# Patient Record
Sex: Male | Born: 1965 | Race: White | Hispanic: No | Marital: Married
Health system: Midwestern US, Community
[De-identification: ages and names within clinical notes are randomized; demographics above are authoritative.]

## PROBLEM LIST (undated history)

## (undated) DIAGNOSIS — D595 Paroxysmal nocturnal hemoglobinuria [Marchiafava-Micheli]: Secondary | ICD-10-CM

## (undated) DIAGNOSIS — Z8719 Personal history of other diseases of the digestive system: Secondary | ICD-10-CM

## (undated) DIAGNOSIS — E291 Testicular hypofunction: Secondary | ICD-10-CM

## (undated) DIAGNOSIS — D649 Anemia, unspecified: Secondary | ICD-10-CM

## (undated) DIAGNOSIS — N200 Calculus of kidney: Secondary | ICD-10-CM

## (undated) DIAGNOSIS — D751 Secondary polycythemia: Principal | ICD-10-CM

## (undated) DIAGNOSIS — E611 Iron deficiency: Principal | ICD-10-CM

## (undated) HISTORY — DX: Personal history of other diseases of the digestive system: Z87.19

## (undated) HISTORY — DX: Calculus of kidney: N20.0

## (undated) HISTORY — DX: Anemia, unspecified: D64.9

## (undated) HISTORY — DX: Paroxysmal nocturnal hemoglobinuria (Marchiafava-Micheli): D59.5

## (undated) HISTORY — DX: Testicular hypofunction: E29.1

## (undated) HISTORY — PX: HERNIA REPAIR: SHX51

---

## 2002-06-20 ENCOUNTER — Emergency Department (HOSPITAL_COMMUNITY): Admission: EM | Admit: 2002-06-20 | Discharge: 2002-06-20 | Payer: Self-pay

## 2004-02-13 ENCOUNTER — Ambulatory Visit: Payer: Self-pay | Admitting: Internal Medicine

## 2004-02-27 ENCOUNTER — Ambulatory Visit: Payer: Self-pay | Admitting: Internal Medicine

## 2004-03-09 ENCOUNTER — Ambulatory Visit: Payer: Self-pay | Admitting: Internal Medicine

## 2004-03-22 ENCOUNTER — Ambulatory Visit: Payer: Self-pay | Admitting: Internal Medicine

## 2004-04-09 ENCOUNTER — Ambulatory Visit: Payer: Self-pay | Admitting: Internal Medicine

## 2004-04-23 ENCOUNTER — Ambulatory Visit: Payer: Self-pay | Admitting: Internal Medicine

## 2004-05-04 ENCOUNTER — Ambulatory Visit: Payer: Self-pay | Admitting: Internal Medicine

## 2004-05-18 ENCOUNTER — Ambulatory Visit: Payer: Self-pay | Admitting: Internal Medicine

## 2004-06-01 ENCOUNTER — Ambulatory Visit: Payer: Self-pay | Admitting: Internal Medicine

## 2004-06-18 ENCOUNTER — Ambulatory Visit: Payer: Self-pay | Admitting: Internal Medicine

## 2004-06-29 ENCOUNTER — Ambulatory Visit: Payer: Self-pay | Admitting: Internal Medicine

## 2004-07-12 ENCOUNTER — Ambulatory Visit: Payer: Self-pay | Admitting: Internal Medicine

## 2004-07-27 ENCOUNTER — Ambulatory Visit: Payer: Self-pay | Admitting: Internal Medicine

## 2004-08-10 ENCOUNTER — Ambulatory Visit: Payer: Self-pay | Admitting: Internal Medicine

## 2004-08-24 ENCOUNTER — Ambulatory Visit: Payer: Self-pay | Admitting: Internal Medicine

## 2004-09-07 ENCOUNTER — Ambulatory Visit: Payer: Self-pay | Admitting: Internal Medicine

## 2004-09-21 ENCOUNTER — Ambulatory Visit: Payer: Self-pay | Admitting: Internal Medicine

## 2004-10-05 ENCOUNTER — Ambulatory Visit: Payer: Self-pay | Admitting: Internal Medicine

## 2004-10-19 ENCOUNTER — Ambulatory Visit: Payer: Self-pay | Admitting: Internal Medicine

## 2004-11-02 ENCOUNTER — Ambulatory Visit: Payer: Self-pay | Admitting: Internal Medicine

## 2004-11-16 ENCOUNTER — Ambulatory Visit: Payer: Self-pay | Admitting: Internal Medicine

## 2004-11-25 ENCOUNTER — Emergency Department (HOSPITAL_COMMUNITY): Admission: EM | Admit: 2004-11-25 | Discharge: 2004-11-25 | Payer: Self-pay | Admitting: Emergency Medicine

## 2004-11-30 ENCOUNTER — Ambulatory Visit: Payer: Self-pay | Admitting: Internal Medicine

## 2004-12-14 ENCOUNTER — Ambulatory Visit: Payer: Self-pay | Admitting: Internal Medicine

## 2004-12-28 ENCOUNTER — Ambulatory Visit: Payer: Self-pay | Admitting: Internal Medicine

## 2005-01-11 ENCOUNTER — Ambulatory Visit: Payer: Self-pay | Admitting: Internal Medicine

## 2005-01-28 ENCOUNTER — Ambulatory Visit: Payer: Self-pay | Admitting: Internal Medicine

## 2005-02-11 ENCOUNTER — Ambulatory Visit: Payer: Self-pay | Admitting: Internal Medicine

## 2005-02-25 ENCOUNTER — Ambulatory Visit: Payer: Self-pay | Admitting: Internal Medicine

## 2005-03-08 ENCOUNTER — Ambulatory Visit: Payer: Self-pay | Admitting: Internal Medicine

## 2005-03-21 ENCOUNTER — Ambulatory Visit: Payer: Self-pay | Admitting: Internal Medicine

## 2005-04-05 ENCOUNTER — Ambulatory Visit: Payer: Self-pay | Admitting: Internal Medicine

## 2005-04-19 ENCOUNTER — Ambulatory Visit: Payer: Self-pay | Admitting: Internal Medicine

## 2005-05-17 ENCOUNTER — Ambulatory Visit: Payer: Self-pay | Admitting: Internal Medicine

## 2005-06-14 ENCOUNTER — Ambulatory Visit: Payer: Self-pay | Admitting: Internal Medicine

## 2005-06-28 ENCOUNTER — Ambulatory Visit: Payer: Self-pay | Admitting: Internal Medicine

## 2005-07-12 ENCOUNTER — Ambulatory Visit: Payer: Self-pay | Admitting: Internal Medicine

## 2005-07-26 ENCOUNTER — Ambulatory Visit: Payer: Self-pay | Admitting: Internal Medicine

## 2005-08-09 ENCOUNTER — Ambulatory Visit: Payer: Self-pay | Admitting: Internal Medicine

## 2005-08-27 ENCOUNTER — Ambulatory Visit: Payer: Self-pay | Admitting: Internal Medicine

## 2005-09-06 ENCOUNTER — Ambulatory Visit: Payer: Self-pay | Admitting: Internal Medicine

## 2005-09-20 ENCOUNTER — Ambulatory Visit: Payer: Self-pay | Admitting: Internal Medicine

## 2005-10-04 ENCOUNTER — Ambulatory Visit: Payer: Self-pay | Admitting: Internal Medicine

## 2005-10-18 ENCOUNTER — Ambulatory Visit: Payer: Self-pay | Admitting: Internal Medicine

## 2005-11-01 ENCOUNTER — Ambulatory Visit: Payer: Self-pay | Admitting: Internal Medicine

## 2005-11-15 ENCOUNTER — Ambulatory Visit: Payer: Self-pay | Admitting: Internal Medicine

## 2005-12-03 ENCOUNTER — Ambulatory Visit: Payer: Self-pay | Admitting: Internal Medicine

## 2005-12-16 ENCOUNTER — Ambulatory Visit: Payer: Self-pay | Admitting: Internal Medicine

## 2005-12-27 ENCOUNTER — Ambulatory Visit: Payer: Self-pay | Admitting: Internal Medicine

## 2006-01-07 ENCOUNTER — Ambulatory Visit: Payer: Self-pay | Admitting: Internal Medicine

## 2006-01-10 ENCOUNTER — Ambulatory Visit: Payer: Self-pay | Admitting: Internal Medicine

## 2006-01-24 ENCOUNTER — Ambulatory Visit: Payer: Self-pay | Admitting: Internal Medicine

## 2006-01-27 ENCOUNTER — Ambulatory Visit: Payer: Self-pay | Admitting: Internal Medicine

## 2006-02-07 ENCOUNTER — Ambulatory Visit: Payer: Self-pay | Admitting: Internal Medicine

## 2006-02-24 ENCOUNTER — Ambulatory Visit: Payer: Self-pay | Admitting: Internal Medicine

## 2006-03-10 ENCOUNTER — Ambulatory Visit: Payer: Self-pay | Admitting: Internal Medicine

## 2006-03-21 ENCOUNTER — Ambulatory Visit: Payer: Self-pay | Admitting: Internal Medicine

## 2006-04-07 ENCOUNTER — Ambulatory Visit: Payer: Self-pay | Admitting: Internal Medicine

## 2006-04-22 ENCOUNTER — Ambulatory Visit: Payer: Self-pay | Admitting: Internal Medicine

## 2006-05-02 ENCOUNTER — Ambulatory Visit: Payer: Self-pay | Admitting: Internal Medicine

## 2006-05-16 ENCOUNTER — Ambulatory Visit: Payer: Self-pay | Admitting: Internal Medicine

## 2006-05-30 ENCOUNTER — Ambulatory Visit: Payer: Self-pay | Admitting: Internal Medicine

## 2006-06-13 ENCOUNTER — Ambulatory Visit: Payer: Self-pay | Admitting: Internal Medicine

## 2006-06-18 ENCOUNTER — Ambulatory Visit: Payer: Self-pay | Admitting: Internal Medicine

## 2006-06-18 LAB — CONVERTED CEMR LAB
ALT: 38 units/L (ref 0–40)
AST: 63 units/L — ABNORMAL HIGH (ref 0–37)
Basophils Absolute: 0 10*3/uL (ref 0.0–0.1)
Bilirubin, Direct: 0.3 mg/dL (ref 0.0–0.3)
CO2: 33 meq/L — ABNORMAL HIGH (ref 19–32)
Chloride: 103 meq/L (ref 96–112)
Cholesterol: 165 mg/dL (ref 0–200)
Eosinophils Absolute: 0 10*3/uL (ref 0.0–0.6)
GFR calc non Af Amer: 88 mL/min
Glucose, Bld: 83 mg/dL (ref 70–99)
HCT: 47.2 % (ref 39.0–52.0)
Hemoglobin: 14.1 g/dL (ref 13.0–17.0)
Ketones, ur: NEGATIVE mg/dL
Leukocytes, UA: NEGATIVE
Lymphocytes Relative: 10.5 % — ABNORMAL LOW (ref 12.0–46.0)
MCHC: 34.3 g/dL (ref 30.0–36.0)
MCV: 91.1 fL (ref 78.0–100.0)
Monocytes Absolute: 0.3 10*3/uL (ref 0.2–0.7)
Mucus, UA: NEGATIVE
Neutro Abs: 2.7 10*3/uL (ref 1.4–7.7)
Neutrophils Relative %: 79.2 % — ABNORMAL HIGH (ref 43.0–77.0)
Nitrite: NEGATIVE
Sodium: 142 meq/L (ref 135–145)
TSH: 2.04 microintl units/mL (ref 0.35–5.50)
Total CHOL/HDL Ratio: 4.4
Total Protein: 7 g/dL (ref 6.0–8.3)
pH: 6 (ref 5.0–8.0)

## 2006-06-23 ENCOUNTER — Ambulatory Visit: Payer: Self-pay | Admitting: Internal Medicine

## 2006-06-23 LAB — CONVERTED CEMR LAB
HCV Ab: NEGATIVE
Hep B S Ab: NEGATIVE
Saturation Ratios: 9.9 % — ABNORMAL LOW (ref 20.0–50.0)

## 2006-06-30 ENCOUNTER — Ambulatory Visit: Payer: Self-pay | Admitting: Internal Medicine

## 2006-07-02 ENCOUNTER — Ambulatory Visit: Payer: Self-pay | Admitting: Internal Medicine

## 2006-07-11 ENCOUNTER — Ambulatory Visit: Payer: Self-pay | Admitting: Internal Medicine

## 2006-07-25 ENCOUNTER — Ambulatory Visit: Payer: Self-pay | Admitting: Internal Medicine

## 2006-08-08 ENCOUNTER — Ambulatory Visit: Payer: Self-pay | Admitting: Internal Medicine

## 2006-08-27 ENCOUNTER — Encounter: Payer: Self-pay | Admitting: Endocrinology

## 2006-08-27 DIAGNOSIS — D596 Hemoglobinuria due to hemolysis from other external causes: Secondary | ICD-10-CM

## 2006-08-27 DIAGNOSIS — E291 Testicular hypofunction: Secondary | ICD-10-CM

## 2006-08-29 ENCOUNTER — Ambulatory Visit: Payer: Self-pay | Admitting: Internal Medicine

## 2006-09-12 ENCOUNTER — Ambulatory Visit: Payer: Self-pay | Admitting: Internal Medicine

## 2006-09-30 ENCOUNTER — Ambulatory Visit: Payer: Self-pay | Admitting: Internal Medicine

## 2006-10-10 ENCOUNTER — Ambulatory Visit: Payer: Self-pay | Admitting: Internal Medicine

## 2006-10-24 ENCOUNTER — Ambulatory Visit: Payer: Self-pay | Admitting: Internal Medicine

## 2006-11-10 ENCOUNTER — Ambulatory Visit: Payer: Self-pay | Admitting: Internal Medicine

## 2006-11-11 ENCOUNTER — Ambulatory Visit: Payer: Self-pay | Admitting: Internal Medicine

## 2006-11-28 ENCOUNTER — Ambulatory Visit: Payer: Self-pay | Admitting: Internal Medicine

## 2006-12-12 ENCOUNTER — Ambulatory Visit: Payer: Self-pay | Admitting: Internal Medicine

## 2006-12-29 ENCOUNTER — Ambulatory Visit: Payer: Self-pay | Admitting: Internal Medicine

## 2007-01-09 ENCOUNTER — Ambulatory Visit: Payer: Self-pay | Admitting: Internal Medicine

## 2007-01-13 ENCOUNTER — Ambulatory Visit: Payer: Self-pay | Admitting: Internal Medicine

## 2007-01-23 ENCOUNTER — Ambulatory Visit: Payer: Self-pay | Admitting: Internal Medicine

## 2007-02-06 ENCOUNTER — Ambulatory Visit: Payer: Self-pay | Admitting: Internal Medicine

## 2007-02-06 ENCOUNTER — Encounter: Payer: Self-pay | Admitting: Internal Medicine

## 2007-02-06 DIAGNOSIS — J168 Pneumonia due to other specified infectious organisms: Secondary | ICD-10-CM

## 2007-02-19 ENCOUNTER — Ambulatory Visit: Payer: Self-pay | Admitting: Internal Medicine

## 2007-03-10 ENCOUNTER — Ambulatory Visit: Payer: Self-pay | Admitting: Internal Medicine

## 2007-03-11 ENCOUNTER — Encounter: Payer: Self-pay | Admitting: Internal Medicine

## 2007-03-20 ENCOUNTER — Ambulatory Visit: Payer: Self-pay | Admitting: Internal Medicine

## 2007-04-03 ENCOUNTER — Ambulatory Visit: Payer: Self-pay | Admitting: Internal Medicine

## 2007-04-17 ENCOUNTER — Ambulatory Visit: Payer: Self-pay | Admitting: Internal Medicine

## 2007-05-01 ENCOUNTER — Ambulatory Visit: Payer: Self-pay | Admitting: Internal Medicine

## 2007-05-13 ENCOUNTER — Ambulatory Visit: Payer: Self-pay | Admitting: Internal Medicine

## 2007-05-29 ENCOUNTER — Ambulatory Visit: Payer: Self-pay | Admitting: Internal Medicine

## 2007-06-12 ENCOUNTER — Ambulatory Visit: Payer: Self-pay | Admitting: Internal Medicine

## 2007-06-26 ENCOUNTER — Ambulatory Visit: Payer: Self-pay | Admitting: Internal Medicine

## 2007-07-09 ENCOUNTER — Ambulatory Visit: Payer: Self-pay | Admitting: Internal Medicine

## 2007-07-14 ENCOUNTER — Ambulatory Visit: Payer: Self-pay | Admitting: Internal Medicine

## 2007-07-14 DIAGNOSIS — R071 Chest pain on breathing: Secondary | ICD-10-CM | POA: Insufficient documentation

## 2007-07-21 ENCOUNTER — Ambulatory Visit: Payer: Self-pay | Admitting: Internal Medicine

## 2007-07-22 ENCOUNTER — Ambulatory Visit: Payer: Self-pay | Admitting: Internal Medicine

## 2007-08-06 ENCOUNTER — Ambulatory Visit: Payer: Self-pay | Admitting: Internal Medicine

## 2007-08-20 ENCOUNTER — Encounter: Payer: Self-pay | Admitting: Endocrinology

## 2007-08-21 ENCOUNTER — Ambulatory Visit: Payer: Self-pay | Admitting: Internal Medicine

## 2007-09-04 ENCOUNTER — Ambulatory Visit: Payer: Self-pay | Admitting: Internal Medicine

## 2007-09-18 ENCOUNTER — Ambulatory Visit: Payer: Self-pay | Admitting: Internal Medicine

## 2007-10-01 ENCOUNTER — Telehealth (INDEPENDENT_AMBULATORY_CARE_PROVIDER_SITE_OTHER): Payer: Self-pay | Admitting: *Deleted

## 2007-10-01 ENCOUNTER — Ambulatory Visit: Payer: Self-pay | Admitting: Internal Medicine

## 2007-10-16 ENCOUNTER — Ambulatory Visit: Payer: Self-pay | Admitting: Internal Medicine

## 2007-10-29 ENCOUNTER — Ambulatory Visit: Payer: Self-pay | Admitting: Internal Medicine

## 2007-11-13 ENCOUNTER — Ambulatory Visit: Payer: Self-pay | Admitting: Internal Medicine

## 2007-11-27 ENCOUNTER — Ambulatory Visit: Payer: Self-pay | Admitting: Internal Medicine

## 2007-12-11 ENCOUNTER — Ambulatory Visit: Payer: Self-pay | Admitting: Internal Medicine

## 2007-12-25 ENCOUNTER — Ambulatory Visit: Payer: Self-pay | Admitting: Internal Medicine

## 2008-01-07 ENCOUNTER — Encounter: Payer: Self-pay | Admitting: Endocrinology

## 2008-01-08 ENCOUNTER — Ambulatory Visit: Payer: Self-pay | Admitting: Internal Medicine

## 2008-01-22 ENCOUNTER — Ambulatory Visit: Payer: Self-pay | Admitting: Internal Medicine

## 2008-02-05 ENCOUNTER — Ambulatory Visit: Payer: Self-pay | Admitting: Internal Medicine

## 2008-02-16 ENCOUNTER — Telehealth (INDEPENDENT_AMBULATORY_CARE_PROVIDER_SITE_OTHER): Payer: Self-pay | Admitting: *Deleted

## 2008-02-18 ENCOUNTER — Ambulatory Visit: Payer: Self-pay | Admitting: Internal Medicine

## 2008-02-18 DIAGNOSIS — K439 Ventral hernia without obstruction or gangrene: Secondary | ICD-10-CM | POA: Insufficient documentation

## 2008-03-04 ENCOUNTER — Ambulatory Visit: Payer: Self-pay | Admitting: Internal Medicine

## 2008-03-18 ENCOUNTER — Ambulatory Visit: Payer: Self-pay | Admitting: Internal Medicine

## 2008-04-04 ENCOUNTER — Ambulatory Visit: Payer: Self-pay | Admitting: Internal Medicine

## 2008-04-15 ENCOUNTER — Ambulatory Visit: Payer: Self-pay | Admitting: Internal Medicine

## 2008-04-29 ENCOUNTER — Ambulatory Visit: Payer: Self-pay | Admitting: Internal Medicine

## 2008-05-13 ENCOUNTER — Ambulatory Visit: Payer: Self-pay | Admitting: Internal Medicine

## 2008-05-30 ENCOUNTER — Ambulatory Visit: Payer: Self-pay | Admitting: Internal Medicine

## 2008-06-13 ENCOUNTER — Ambulatory Visit: Payer: Self-pay | Admitting: Internal Medicine

## 2008-06-24 ENCOUNTER — Ambulatory Visit: Payer: Self-pay | Admitting: Internal Medicine

## 2008-07-08 ENCOUNTER — Ambulatory Visit: Payer: Self-pay | Admitting: Internal Medicine

## 2008-07-14 ENCOUNTER — Encounter: Payer: Self-pay | Admitting: Endocrinology

## 2008-07-22 ENCOUNTER — Ambulatory Visit: Payer: Self-pay | Admitting: Internal Medicine

## 2008-08-05 ENCOUNTER — Ambulatory Visit: Payer: Self-pay | Admitting: Internal Medicine

## 2008-08-19 ENCOUNTER — Ambulatory Visit: Payer: Self-pay | Admitting: Internal Medicine

## 2008-09-02 ENCOUNTER — Ambulatory Visit: Payer: Self-pay | Admitting: Internal Medicine

## 2008-09-16 ENCOUNTER — Ambulatory Visit: Payer: Self-pay | Admitting: Internal Medicine

## 2008-09-29 ENCOUNTER — Ambulatory Visit: Payer: Self-pay | Admitting: Internal Medicine

## 2008-10-14 ENCOUNTER — Ambulatory Visit: Payer: Self-pay | Admitting: Internal Medicine

## 2008-10-28 ENCOUNTER — Ambulatory Visit: Payer: Self-pay | Admitting: Internal Medicine

## 2008-11-11 ENCOUNTER — Ambulatory Visit: Payer: Self-pay | Admitting: Internal Medicine

## 2008-11-14 ENCOUNTER — Ambulatory Visit: Payer: Self-pay | Admitting: Internal Medicine

## 2008-11-14 DIAGNOSIS — M79609 Pain in unspecified limb: Secondary | ICD-10-CM | POA: Insufficient documentation

## 2008-11-16 ENCOUNTER — Encounter: Payer: Self-pay | Admitting: Internal Medicine

## 2008-11-25 ENCOUNTER — Ambulatory Visit: Payer: Self-pay | Admitting: Internal Medicine

## 2008-12-09 ENCOUNTER — Ambulatory Visit: Payer: Self-pay | Admitting: Internal Medicine

## 2008-12-15 ENCOUNTER — Encounter: Payer: Self-pay | Admitting: Internal Medicine

## 2008-12-15 ENCOUNTER — Telehealth: Payer: Self-pay | Admitting: Internal Medicine

## 2008-12-15 ENCOUNTER — Ambulatory Visit: Payer: Self-pay | Admitting: Internal Medicine

## 2008-12-15 DIAGNOSIS — R509 Fever, unspecified: Secondary | ICD-10-CM

## 2008-12-15 DIAGNOSIS — D72819 Decreased white blood cell count, unspecified: Secondary | ICD-10-CM | POA: Insufficient documentation

## 2008-12-15 LAB — CONVERTED CEMR LAB
ALT: 66 units/L — ABNORMAL HIGH (ref 0–53)
Albumin: 4 g/dL (ref 3.5–5.2)
Basophils Relative: 0.5 % (ref 0.0–3.0)
Bilirubin, Direct: 0.4 mg/dL — ABNORMAL HIGH (ref 0.0–0.3)
CO2: 35 meq/L — ABNORMAL HIGH (ref 19–32)
Chloride: 100 meq/L (ref 96–112)
Creatinine, Ser: 1.1 mg/dL (ref 0.4–1.5)
Eosinophils Absolute: 0 10*3/uL (ref 0.0–0.7)
Eosinophils Relative: 0.3 % (ref 0.0–5.0)
HCT: 50.3 % (ref 39.0–52.0)
Hemoglobin: 17 g/dL (ref 13.0–17.0)
Leukocytes, UA: NEGATIVE
Lymphs Abs: 0.2 10*3/uL — ABNORMAL LOW (ref 0.7–4.0)
MCHC: 33.8 g/dL (ref 30.0–36.0)
MCV: 97.8 fL (ref 78.0–100.0)
Monocytes Absolute: 0.1 10*3/uL (ref 0.1–1.0)
Neutro Abs: 1.2 10*3/uL — ABNORMAL LOW (ref 1.4–7.7)
Potassium: 4.6 meq/L (ref 3.5–5.1)
RBC: 5.14 M/uL (ref 4.22–5.81)
Sodium: 141 meq/L (ref 135–145)
Specific Gravity, Urine: <=1.005 (ref 1.000–1.030)
Total Protein: 7 g/dL (ref 6.0–8.3)
Urine Glucose: NEGATIVE mg/dL
Urobilinogen, UA: 1 (ref 0.0–1.0)
WBC: 1.5 10*3/uL — CL (ref 4.5–10.5)

## 2008-12-16 ENCOUNTER — Telehealth: Payer: Self-pay | Admitting: Internal Medicine

## 2008-12-20 ENCOUNTER — Encounter: Payer: Self-pay | Admitting: Endocrinology

## 2008-12-28 ENCOUNTER — Ambulatory Visit: Payer: Self-pay | Admitting: Internal Medicine

## 2008-12-29 ENCOUNTER — Encounter: Payer: Self-pay | Admitting: Endocrinology

## 2009-01-03 ENCOUNTER — Encounter: Payer: Self-pay | Admitting: Internal Medicine

## 2009-01-06 ENCOUNTER — Ambulatory Visit: Payer: Self-pay | Admitting: Internal Medicine

## 2009-01-10 ENCOUNTER — Encounter: Payer: Self-pay | Admitting: Internal Medicine

## 2009-01-20 ENCOUNTER — Telehealth: Payer: Self-pay | Admitting: Internal Medicine

## 2009-01-20 ENCOUNTER — Ambulatory Visit: Payer: Self-pay | Admitting: Internal Medicine

## 2009-02-03 ENCOUNTER — Ambulatory Visit: Payer: Self-pay | Admitting: Internal Medicine

## 2009-02-07 ENCOUNTER — Telehealth (INDEPENDENT_AMBULATORY_CARE_PROVIDER_SITE_OTHER): Payer: Self-pay | Admitting: *Deleted

## 2009-02-07 ENCOUNTER — Encounter: Payer: Self-pay | Admitting: Internal Medicine

## 2009-02-17 ENCOUNTER — Ambulatory Visit: Payer: Self-pay | Admitting: Internal Medicine

## 2009-03-02 ENCOUNTER — Encounter: Payer: Self-pay | Admitting: Internal Medicine

## 2009-03-03 ENCOUNTER — Ambulatory Visit: Payer: Self-pay | Admitting: Internal Medicine

## 2009-03-20 ENCOUNTER — Ambulatory Visit: Payer: Self-pay | Admitting: Internal Medicine

## 2009-04-03 ENCOUNTER — Ambulatory Visit: Payer: Self-pay | Admitting: Internal Medicine

## 2009-04-05 ENCOUNTER — Ambulatory Visit: Payer: Self-pay | Admitting: Internal Medicine

## 2009-04-17 ENCOUNTER — Ambulatory Visit: Payer: Self-pay | Admitting: Internal Medicine

## 2009-04-18 ENCOUNTER — Ambulatory Visit: Payer: Self-pay | Admitting: Internal Medicine

## 2009-05-05 ENCOUNTER — Ambulatory Visit: Payer: Self-pay | Admitting: Internal Medicine

## 2009-05-19 ENCOUNTER — Telehealth: Payer: Self-pay | Admitting: Internal Medicine

## 2009-05-19 ENCOUNTER — Ambulatory Visit: Payer: Self-pay | Admitting: Internal Medicine

## 2009-06-02 ENCOUNTER — Ambulatory Visit: Payer: Self-pay | Admitting: Internal Medicine

## 2009-06-16 ENCOUNTER — Ambulatory Visit: Payer: Self-pay | Admitting: Internal Medicine

## 2009-06-30 ENCOUNTER — Telehealth: Payer: Self-pay | Admitting: Internal Medicine

## 2009-06-30 ENCOUNTER — Ambulatory Visit: Payer: Self-pay | Admitting: Internal Medicine

## 2009-07-14 ENCOUNTER — Ambulatory Visit: Payer: Self-pay | Admitting: Internal Medicine

## 2009-07-31 ENCOUNTER — Ambulatory Visit: Payer: Self-pay | Admitting: Internal Medicine

## 2009-08-11 ENCOUNTER — Ambulatory Visit: Payer: Self-pay | Admitting: Internal Medicine

## 2009-08-25 ENCOUNTER — Ambulatory Visit: Payer: Self-pay | Admitting: Internal Medicine

## 2009-08-30 ENCOUNTER — Ambulatory Visit: Payer: Self-pay | Admitting: Internal Medicine

## 2009-09-08 ENCOUNTER — Ambulatory Visit: Payer: Self-pay | Admitting: Internal Medicine

## 2009-09-25 ENCOUNTER — Ambulatory Visit: Payer: Self-pay | Admitting: Internal Medicine

## 2009-09-26 ENCOUNTER — Ambulatory Visit: Payer: Self-pay | Admitting: Internal Medicine

## 2009-09-28 ENCOUNTER — Encounter: Payer: Self-pay | Admitting: Endocrinology

## 2009-10-06 ENCOUNTER — Ambulatory Visit: Payer: Self-pay | Admitting: Internal Medicine

## 2009-10-16 ENCOUNTER — Encounter: Payer: Self-pay | Admitting: Endocrinology

## 2009-10-20 ENCOUNTER — Ambulatory Visit: Payer: Self-pay | Admitting: Internal Medicine

## 2009-11-06 ENCOUNTER — Ambulatory Visit: Payer: Self-pay | Admitting: Internal Medicine

## 2009-11-21 ENCOUNTER — Ambulatory Visit: Payer: Self-pay | Admitting: Internal Medicine

## 2009-12-01 ENCOUNTER — Ambulatory Visit: Payer: Self-pay | Admitting: Internal Medicine

## 2009-12-15 ENCOUNTER — Ambulatory Visit: Payer: Self-pay | Admitting: Internal Medicine

## 2009-12-22 ENCOUNTER — Ambulatory Visit: Payer: Self-pay | Admitting: Internal Medicine

## 2009-12-29 ENCOUNTER — Ambulatory Visit: Payer: Self-pay | Admitting: Internal Medicine

## 2010-01-03 ENCOUNTER — Encounter: Payer: Self-pay | Admitting: Internal Medicine

## 2010-01-12 ENCOUNTER — Ambulatory Visit: Payer: Self-pay | Admitting: Internal Medicine

## 2010-01-31 ENCOUNTER — Ambulatory Visit: Payer: Self-pay | Admitting: Internal Medicine

## 2010-02-02 ENCOUNTER — Ambulatory Visit: Payer: Self-pay | Admitting: Internal Medicine

## 2010-02-02 ENCOUNTER — Telehealth: Payer: Self-pay | Admitting: Internal Medicine

## 2010-02-02 ENCOUNTER — Ambulatory Visit: Payer: Self-pay | Admitting: Cardiology

## 2010-02-02 DIAGNOSIS — R109 Unspecified abdominal pain: Secondary | ICD-10-CM

## 2010-02-02 DIAGNOSIS — M542 Cervicalgia: Secondary | ICD-10-CM

## 2010-02-19 ENCOUNTER — Ambulatory Visit: Payer: Self-pay | Admitting: Internal Medicine

## 2010-03-05 ENCOUNTER — Ambulatory Visit: Payer: Self-pay | Admitting: Internal Medicine

## 2010-03-19 ENCOUNTER — Ambulatory Visit: Payer: Self-pay | Admitting: Internal Medicine

## 2010-03-30 ENCOUNTER — Ambulatory Visit: Payer: Self-pay | Admitting: Internal Medicine

## 2010-04-13 ENCOUNTER — Ambulatory Visit
Admission: RE | Admit: 2010-04-13 | Discharge: 2010-04-13 | Payer: Self-pay | Source: Home / Self Care | Attending: Internal Medicine | Admitting: Internal Medicine

## 2010-04-27 ENCOUNTER — Ambulatory Visit
Admission: RE | Admit: 2010-04-27 | Discharge: 2010-04-27 | Payer: Self-pay | Source: Home / Self Care | Attending: Internal Medicine | Admitting: Internal Medicine

## 2010-05-03 NOTE — Assessment & Plan Note (Signed)
Summary: TESTOSTERONE/MEN/NWS  Nurse Visit   Allergies: No Known Drug Allergies  Medication Administration  Injection # 1:    Medication: Testosterone Cypionat 200mg  ing    Diagnosis: HYPOGONADISM (ICD-257.2)    Route: IM    Site: RUOQ gluteus    Exp Date: 07/31/2011    Lot #: [id number]  Orders Added: 1)  Testosterone Cypionat 200mg  ing [J1080]

## 2010-05-03 NOTE — Assessment & Plan Note (Signed)
Summary: TEST INJ--MEN--STC  Nurse Visit   Allergies: No Known Drug Allergies  Medication Administration  Injection # 1:    Medication: Testosterone Cypionat 200mg  ing    Diagnosis: HYPOGONADISM (ICD-257.2)    Route: IM    Site: LUOQ gluteus    Exp Date: 07/31/2011    Lot #: [id number]    Comments: 1 cc    Patient tolerated injection without complications    Given by: Lamar Sprinkles, CMA (December 01, 2009 9:10 AM)  Orders Added: 1)  Admin of patients own med IM/SQ [96372M]   Medication Administration  Injection # 1:    Medication: Testosterone Cypionat 200mg  ing    Diagnosis: HYPOGONADISM (ICD-257.2)    Route: IM    Site: LUOQ gluteus    Exp Date: 07/31/2011    Lot #: [id number]    Comments: 1 cc    Patient tolerated injection without complications    Given by: Lamar Sprinkles, CMA (December 01, 2009 9:10 AM)  Orders Added: 1)  Admin of patients own med IM/SQ [phone]

## 2010-05-03 NOTE — Assessment & Plan Note (Signed)
Summary: TESTOSTERONE/ MEN Natale Milch  Nurse Visit  CC: Testosteron injection./kb   Allergies: No Known Drug Allergies  Medication Administration  Injection # 1:    Medication: Testosterone Cypionat 200mg  ing    Diagnosis: HYPOGONADISM (ICD-257.2)    Route: IM    Site: LUOQ gluteus    Exp Date: 07/31/2011    Lot #: [id number]    Mfr: watson    Comments: 1cc    Patient tolerated injection without complications    Given by: Lucious Groves (October 06, 2009 5:04 PM)  Orders Added: 1)  Testosterone Cypionat 200mg  ing [J1080] 2)  Admin of patients own med IM/SQ [[id number]]

## 2010-05-03 NOTE — Assessment & Plan Note (Signed)
Summary: TESTOSTERONE/ MEN /NWS  Nurse Visit   Allergies: No Known Drug Allergies  Medication Administration  Injection # 1:    Medication: Testosterone Cypionat 200mg  ing    Diagnosis: HYPOGONADISM (ICD-257.2)    Route: IM    Site: RUOQ gluteus    Exp Date: 05/03/2011    Lot #: [id number]    Mfr: Claudette Laws    Comments: 1 cc    Patient tolerated injection without complications    Given by: Lamar Sprinkles, CMA (June 16, 2009 1:29 PM)  Orders Added: 1)  Admin of patients own med IM/SQ [96372M]   Medication Administration  Injection # 1:    Medication: Testosterone Cypionat 200mg  ing    Diagnosis: HYPOGONADISM (ICD-257.2)    Route: IM    Site: RUOQ gluteus    Exp Date: 05/03/2011    Lot #: [id number]    Mfr: Claudette Laws    Comments: 1 cc    Patient tolerated injection without complications    Given by: Lamar Sprinkles, CMA (June 16, 2009 1:29 PM)  Orders Added: 1)  Admin of patients own med IM/SQ [phone]

## 2010-05-03 NOTE — Progress Notes (Signed)
     New Problems: PELVIC PAIN, ACUTE (ICD-789.09) NECK AND BACK PAIN (ICD-723.1)   New Problems: PELVIC PAIN, ACUTE (ICD-789.09) NECK AND BACK PAIN (ICD-723.1)

## 2010-05-03 NOTE — Assessment & Plan Note (Signed)
Summary: TESTOSTERONE/ MEN /NWS  Nurse Visit   Allergies: No Known Drug Allergies  Medication Administration  Injection # 1:    Medication: Testosterone Cypionat 200mg  ing    Diagnosis: HYPOGONADISM (ICD-257.2)    Route: IM    Site: LUOQ gluteus    Exp Date: 07/31/2011    Lot #: [id number]    Mfr: WATSON    Comments: 1 mL    Patient tolerated injection without complications    Given by: Lamar Sprinkles, CMA (January 12, 2010 10:00 AM)  Orders Added: 1)  Admin of patients own med IM/SQ [96372M]   Medication Administration  Injection # 1:    Medication: Testosterone Cypionat 200mg  ing    Diagnosis: HYPOGONADISM (ICD-257.2)    Route: IM    Site: LUOQ gluteus    Exp Date: 07/31/2011    Lot #: [id number]    Mfr: WATSON    Comments: 1 mL    Patient tolerated injection without complications    Given by: Lamar Sprinkles, CMA (January 12, 2010 10:00 AM)  Orders Added: 1)  Admin of patients own med IM/SQ [phone]

## 2010-05-03 NOTE — Assessment & Plan Note (Signed)
Summary: TESTOSTERONE INJ/ MEN /NWS  Nurse Visit   Allergies: No Known Drug Allergies  Medication Administration  Injection # 1:    Medication: Testosterone Cypionat 200mg  ing    Diagnosis: HYPOGONADISM (ICD-257.2)    Route: IM    Site: RUOQ gluteus    Exp Date: 05/03/2011    Lot #: [id number]    Mfr: Claudette Laws    Patient tolerated injection without complications    Given by: Lucious Groves (April 05, 2009 9:10 AM)  Orders Added: 1)  Testosterone Cypionat 200mg  ing [J1080] 2)  Admin of Therapeutic Inj  intramuscular or subcutaneous [[id number]]

## 2010-05-03 NOTE — Assessment & Plan Note (Signed)
Summary: TESTOSTERONE/ MEN /NWS  Nurse Visit   Allergies: No Known Drug Allergies  Medication Administration  Injection # 1:    Medication: Testosterone Cypionat 200mg  ing    Diagnosis: HYPOGONADISM (ICD-257.2)    Route: IM    Site: RUOQ gluteus    Exp Date: 07/31/2011    Lot #: [id number]    Mfr: Abraxis    Patient tolerated injection without complications    Given by: Margaret Pyle, CMA (October 20, 2009 10:17 AM)  Orders Added: 1)  Admin of patients own med IM/SQ [phone]

## 2010-05-03 NOTE — Assessment & Plan Note (Signed)
Summary: TESTOSTERONE/ MEN /NWS  Nurse Visit   Allergies: No Known Drug Allergies  Medication Administration  Injection # 1:    Medication: Testosterone Cypionat 200mg  ing    Diagnosis: HYPOGONADISM (ICD-257.2)    Route: IM    Site: LUOQ gluteus    Exp Date: 07/31/2011    Lot #: [id number]    Patient tolerated injection without complications    Given by: Lamar Sprinkles, CMA (June 30, 2009 11:45 AM)  Orders Added: 1)  Admin of patients own med IM/SQ [96372M]   Medication Administration  Injection # 1:    Medication: Testosterone Cypionat 200mg  ing    Diagnosis: HYPOGONADISM (ICD-257.2)    Route: IM    Site: LUOQ gluteus    Exp Date: 07/31/2011    Lot #: [id number]    Patient tolerated injection without complications    Given by: Lamar Sprinkles, CMA (June 30, 2009 11:45 AM)  Orders Added: 1)  Admin of patients own med IM/SQ [phone]

## 2010-05-03 NOTE — Assessment & Plan Note (Signed)
Summary: testosterone injection-lb  Nurse Visit   Vitals Entered By: Josph Macho RMA (August 30, 2009 3:18 PM)  Allergies: No Known Drug Allergies  Medication Administration  Injection # 1:    Medication: Testosterone Cypionat 200mg  ing    Diagnosis: HYPOGONADISM (ICD-257.2)    Route: SQ    Site: RUOQ gluteus    Exp Date: 07/31/2011    Lot #: [id number]    Mfr: Claudette Laws    Patient tolerated injection without complications    Given by: Josph Macho RMA (August 30, 2009 3:22 PM)  Orders Added: 1)  Admin of patients own med IM/SQ [phone]

## 2010-05-03 NOTE — Assessment & Plan Note (Signed)
Summary: testosterone/will bring inj/#/cd  Nurse Visit   Allergies: No Known Drug Allergies  Medication Administration  Injection # 1:    Medication: Testosterone Cypionat 200mg  ing    Diagnosis: HYPOGONADISM (ICD-257.2)    Site: RUOQ gluteus    Exp Date: 10/31/2011    Lot #: [id number]    Comments: 3/4 CC     Patient tolerated injection without complications    Given by: Lamar Sprinkles, CMA (March 05, 2010 2:26 PM)  Orders Added: 1)  Admin of patients own med IM/SQ [96372M]   Medication Administration  Injection # 1:    Medication: Testosterone Cypionat 200mg  ing    Diagnosis: HYPOGONADISM (ICD-257.2)    Site: RUOQ gluteus    Exp Date: 10/31/2011    Lot #: [id number]    Comments: 3/4 CC     Patient tolerated injection without complications    Given by: Lamar Sprinkles, CMA (March 05, 2010 2:26 PM)  Orders Added: 1)  Admin of patients own med IM/SQ [phone]

## 2010-05-03 NOTE — Assessment & Plan Note (Signed)
Summary: TESTOSTERONE/ MEN /NWS  Nurse Visit   Allergies: No Known Drug Allergies  Medication Administration  Injection # 1:    Medication: Testosterone Cypionat 200mg  ing    Diagnosis: HYPOGONADISM (ICD-257.2)    Route: IM    Site: RUOQ gluteus    Exp Date: 05/03/2011    Lot #: [id number]    Mfr: Claudette Laws    Patient tolerated injection without complications    Given by: Lucious Groves (May 05, 2009 8:20 AM)  Orders Added: 1)  Testosterone Cypionat 200mg  ing [J1080] 2)  Admin of patients own med IM/SQ [[id number]]

## 2010-05-03 NOTE — Progress Notes (Signed)
  Phone Note Other Incoming   Summary of Call: EMR ORDER - xray Initial call taken by: Lamar Sprinkles, CMA,  May 19, 2009 2:46 PM

## 2010-05-03 NOTE — Assessment & Plan Note (Signed)
Summary: PER PT D/T--TEST--MEN---STC  Nurse Visit   Allergies: No Known Drug Allergies  Medication Administration  Injection # 1:    Medication: Testosterone Cypionat 200mg  ing    Diagnosis: HYPOGONADISM (ICD-257.2)    Route: IM    Site: LUOQ gluteus    Exp Date: 10/31/2011    Lot #: [id number]    Mfr: [employer]    Comments: pt rec 3/4cc    Patient tolerated injection without complications    Given by: Lanier Prude, [facility]) (March 19, 2010 10:17 AM)  Orders Added: 1)  Admin of patients own med IM/SQ [phone]

## 2010-05-03 NOTE — Progress Notes (Signed)
Summary: LETTER  Phone Note Call from Patient   Summary of Call: Pt needs a letter from Dr Debby Bud asap. He has applied for insurance as not to lose coverage while changing jobs. He was sent to review when applying due to having gallstones as a teenager, BMI (pt goes to gym on regular basis) and med given recenlty for anxiety. Are you able to help w/ letter stating pt is in good health?  Initial call taken by: Lamar Sprinkles, CMA,  June 30, 2009 11:42 AM  Follow-up for Phone Call        can do a letter - if letterhead required will not be ready until Tuesday at the earliest. Follow-up by: Jacques Navy MD,  June 30, 2009 1:17 PM  Additional Follow-up for Phone Call Additional follow up Details #1::        That should be fine if it can be picked up next week before you leave.  Additional Follow-up by: Lamar Sprinkles, CMA,  June 30, 2009 1:37 PM

## 2010-05-03 NOTE — Assessment & Plan Note (Signed)
Summary: TEST MEN STC  Nurse Visit   Allergies: No Known Drug Allergies  Medication Administration  Injection # 1:    Medication: Testosterone Cypionat 200mg  ing    Diagnosis: HYPOGONADISM (ICD-257.2)    Route: IM    Site: LUOQ gluteus    Exp Date: 05/03/2011    Lot #: [id number]    Mfr: Claudette Laws    Patient tolerated injection without complications    Given by: Lamar Sprinkles, CMA (April 18, 2009 1:43 PM)  Orders Added: 1)  Admin of patients own med IM/SQ [96372M]   Medication Administration  Injection # 1:    Medication: Testosterone Cypionat 200mg  ing    Diagnosis: HYPOGONADISM (ICD-257.2)    Route: IM    Site: LUOQ gluteus    Exp Date: 05/03/2011    Lot #: [id number]    Mfr: Claudette Laws    Patient tolerated injection without complications    Given by: Lamar Sprinkles, CMA (April 18, 2009 1:43 PM)  Orders Added: 1)  Admin of patients own med IM/SQ [phone]

## 2010-05-03 NOTE — Assessment & Plan Note (Signed)
Summary: TESTOSTERONE/ MEN /NWS  Nurse Visit   Allergies: No Known Drug Allergies  Medication Administration  Injection # 1:    Medication: Testosterone Cypionat 200mg  ing    Diagnosis: HYPOGONADISM (ICD-257.2)    Route: IM    Site: LUOQ gluteus    Exp Date: 07/31/2011    Lot #: [id number]    Mfr: Claudette Laws    Patient tolerated injection without complications    Given by: Lamar Sprinkles, CMA (November 06, 2009 4:33 PM)  Orders Added: 1)  Admin of patients own med IM/SQ [96372M]   Medication Administration  Injection # 1:    Medication: Testosterone Cypionat 200mg  ing    Diagnosis: HYPOGONADISM (ICD-257.2)    Route: IM    Site: LUOQ gluteus    Exp Date: 07/31/2011    Lot #: [id number]    Mfr: Claudette Laws    Patient tolerated injection without complications    Given by: Lamar Sprinkles, CMA (November 06, 2009 4:33 PM)  Orders Added: 1)  Admin of patients own med IM/SQ [phone]

## 2010-05-03 NOTE — Assessment & Plan Note (Signed)
Summary: TESTOSTERONE/ MEN /NWS  Nurse Visit   Allergies: No Known Drug Allergies  Medication Administration  Injection # 1:    Medication: Testosterone Cypionat 200mg  ing    Diagnosis: HYPOGONADISM (ICD-257.2)    Route: IM    Site: RUOQ gluteus    Exp Date: 07/31/2011    Lot #: [id number]    Mfr: Claudette Laws    Patient tolerated injection without complications    Given by: Ami Bullins CMA (December 29, 2009 9:22 AM)  Orders Added: 1)  Testosterone Cypionat 200mg  ing [J1080]

## 2010-05-03 NOTE — Assessment & Plan Note (Signed)
Summary: TESTOSTERONE INJ/MEN/CD - COMING AFTER 1PM/CD  Nurse Visit   Allergies: No Known Drug Allergies  Medication Administration  Injection # 1:    Medication: Testosterone Cypionat 200mg  ing    Diagnosis: HYPOGONADISM (ICD-257.2)    Route: IM    Site: RUOQ gluteus    Exp Date: 07/31/2011    Lot #: [id number]    Mfr: Claudette Laws    Comments: 1 cc    Patient tolerated injection without complications    Given by: Lamar Sprinkles, CMA (September 26, 2009 1:53 PM)  Orders Added: 1)  Admin of patients own med IM/SQ [96372M]   Medication Administration  Injection # 1:    Medication: Testosterone Cypionat 200mg  ing    Diagnosis: HYPOGONADISM (ICD-257.2)    Route: IM    Site: RUOQ gluteus    Exp Date: 07/31/2011    Lot #: [id number]    Mfr: Claudette Laws    Comments: 1 cc    Patient tolerated injection without complications    Given by: Lamar Sprinkles, CMA (September 26, 2009 1:53 PM)  Orders Added: 1)  Admin of patients own med IM/SQ [phone]

## 2010-05-03 NOTE — Assessment & Plan Note (Signed)
Summary: TESTOSTERONE/ MEN Natale Milch  Nurse Visit   Vitals Entered By: Lamar Sprinkles, CMA (Jul 31, 2009 4:13 PM)  Allergies: No Known Drug Allergies  Medication Administration  Injection # 1:    Medication: Testosterone Cypionat 200mg  ing    Diagnosis: HYPOGONADISM (ICD-257.2)    Route: IM    Site: RUOQ gluteus    Exp Date: 07/31/2011    Lot #: [id number]    Patient tolerated injection without complications    Given by: Lamar Sprinkles, CMA (Jul 31, 2009 4:13 PM)  Orders Added: 1)  Admin of patients own med IM/SQ [96372M]   Medication Administration  Injection # 1:    Medication: Testosterone Cypionat 200mg  ing    Diagnosis: HYPOGONADISM (ICD-257.2)    Route: IM    Site: RUOQ gluteus    Exp Date: 07/31/2011    Lot #: [id number]    Patient tolerated injection without complications    Given by: Lamar Sprinkles, CMA (Jul 31, 2009 4:13 PM)  Orders Added: 1)  Admin of patients own med IM/SQ [phone]

## 2010-05-03 NOTE — Assessment & Plan Note (Signed)
Summary: BREATHING PROBLEM-BACK CHEST AND ABD PAIN STC   Vital Signs:  Patient profile:   45 year old male Height:      71 inches Weight:      223 pounds BMI:     31.21 O2 Sat:      97 % on Room air Temp:     97.4 degrees F oral Pulse rate:   61 / minute BP sitting:   118 / 80  (left arm) Cuff size:   large  Vitals Entered By: Ami Bullins CMA (January 31, 2010 9:28 AM)  O2 Flow:  Room air CC: pt here with c/o ongoing back pain and breathing issues/ ab Comments pt is no longer taking lorazepam, or cyclobenzaprine   Primary Care Lainee Lehrman:  Jacques Navy MD  CC:  pt here with c/o ongoing back pain and breathing issues/ ab.  History of Present Illness: Patient has been having thoracic back pain an difficulty breathing. 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 He has had pain for 12-16 months: from his neck to his groin. intermittent pain, can be assoicated with meals. He can feel a pulling and tugging with motion. Reviewed records from [facility] - he had an U/S recently that was normal. He has had normal chest x-rays.  Current Medications (verified): 1)  Testosterone Cypionate 200 Mg/ml  Oil (Testosterone Cypionate) .Marland Kitchen.. 1cc Q 2 Wks 2)  Folic Acid 1 Mg  Tabs (Folic Acid) .... One By Mouth Once Daily 3)  One-Daily Multivitamins   Tabs (Multiple Vitamin) .... One By Mouth Once Daily 4)  Lorazepam 0.5 Mg Tabs (Lorazepam) .Marland Kitchen.. 1 By Mouth Q6 As Needed Dyspnea or Anxiety 5)  Cyclobenzaprine Hcl 10 Mg Tabs (Cyclobenzaprine Hcl) .Marland Kitchen.. 1 By Mouth Three Times A Day As Needed For Muscle Spasm  Allergies (verified): No Known Drug Allergies  Past History:  Past Medical History: Last updated: 12/15/2008 Hypogonadism Paroxysmal nocturnal hemoglobinuria - [facility] - dr. Elta Guadeloupe [phone] FH reviewed for relevance, SH/Risk Factors reviewed for relevance  Review of Systems       The patient complains of abdominal pain.  The patient denies anorexia,  fever, weight loss, weight gain, decreased hearing, chest pain, dyspnea on exertion, peripheral edema, melena, hematochezia, muscle weakness, difficulty walking, abnormal bleeding, and enlarged lymph nodes.    Physical Exam  General:  Atheletic apapearing white male in no distress Head:  normocephalic.   Eyes:  vision grossly intact, pupils equal, and pupils round. S clear  Lungs:  normal respiratory effort.   Heart:  normal rate and regular rhythm.   Msk:  normal ROM.   Pulses:  2+ radial Neurologic:  alert & oriented X3, cranial nerves II-XII intact, and gait normal.   Skin:  turgor normal and color normal.   Psych:  Oriented X3, normally interactive, and good eye contact.     Impression & Recommendations:  Problem # 1:  CHEST WALL PAIN (JYN-829.56) Patient describes paqin and discomfort that involves the entire torso neck to groin that is intermittent. He dos not appear sick. He has had normal chest x-rays and a recent U/S abdomen at [facility] that wasw read out as normal. His hematologist does not feel this symptom complex is related to PNH. His symptoms may be musculo-skeletal in origin.  Plan - CT thoracic and lumbar spine to r/o discogenic pain           If CTs negative recommend PT consult -  he is provided a referral to [facility]. to use if needed.  Orders: Radiology Referral (Radiology)  Complete Medication List: 1)  Testosterone Cypionate 200 Mg/ml Oil (Testosterone cypionate) .Marland Kitchen.. 1cc q 2 wks 2)  Folic Acid 1 Mg Tabs (Folic acid) .... One by mouth once daily 3)  One-daily Multivitamins Tabs (Multiple vitamin) .... One by mouth once daily 4)  Lorazepam 0.5 Mg Tabs (Lorazepam) .Marland Kitchen.. 1 by mouth q6 as needed dyspnea or anxiety 5)  Cyclobenzaprine Hcl 10 Mg Tabs (Cyclobenzaprine hcl) .Marland Kitchen.. 1 by mouth three times a day as needed for muscle spasm   Orders Added: 1)  Radiology Referral [Radiology] 2)  Est. Patient Level IV [16109]    Prevention & Chronic  Care Immunizations   Influenza vaccine: Not documented    Tetanus booster: Not documented    Pneumococcal vaccine: Not documented  Other Screening   Smoking status: never  (07/14/2007)  Lipids   Total Cholesterol: 165  (06/18/2006)   LDL: 114  (06/18/2006)   LDL Direct: Not documented   HDL: 37.6  (06/18/2006)   Triglycerides: 65  (06/18/2006)

## 2010-05-03 NOTE — Assessment & Plan Note (Signed)
Summary: TESTOSTERONE/ MEN Natale Milch  Nurse Visit   Vitals Entered By: Lamar Sprinkles, CMA (Jul 31, 2009 4:11 PM)  Allergies: No Known Drug Allergies  Medication Administration  Injection # 1:    Medication: Testosterone Cypionat 200mg  ing    Diagnosis: HYPOGONADISM (ICD-257.2)    Route: IM    Site: LUOQ gluteus    Exp Date: 07/31/2011    Lot #: [id number]    Patient tolerated injection without complications    Given by: Lamar Sprinkles, CMA (Jul 31, 2009 4:12 PM)  Orders Added: 1)  Admin of patients own med IM/SQ [16109U]   Medication Administration  Injection # 1:    Medication: Testosterone Cypionat 200mg  ing    Diagnosis: HYPOGONADISM (ICD-257.2)    Route: IM    Site: LUOQ gluteus    Exp Date: 07/31/2011    Lot #: [id number]    Patient tolerated injection without complications    Given by: Lamar Sprinkles, CMA (Jul 31, 2009 4:12 PM)  Orders Added: 1)  Admin of patients own med IM/SQ [phone]

## 2010-05-03 NOTE — Assessment & Plan Note (Signed)
Summary: per d/t--test inj--men--stc  Nurse Visit   Allergies: No Known Drug Allergies  Medication Administration  Injection # 1:    Medication: Testosterone Cypionat 200mg  ing    Route: IM    Site: LUOQ gluteus    Exp Date: 07/31/2011    Lot #: [id number]    Comments: 1 CC    Patient tolerated injection without complications    Given by: Lamar Sprinkles, CMA (Aug 11, 2009 12:01 PM)  Orders Added: 1)  Admin of patients own med IM/SQ [96372M]   Medication Administration  Injection # 1:    Medication: Testosterone Cypionat 200mg  ing    Route: IM    Site: LUOQ gluteus    Exp Date: 07/31/2011    Lot #: [id number]    Comments: 1 CC    Patient tolerated injection without complications    Given by: Lamar Sprinkles, CMA (Aug 11, 2009 12:01 PM)  Orders Added: 1)  Admin of patients own med IM/SQ [phone]

## 2010-05-03 NOTE — Assessment & Plan Note (Signed)
Summary: sob/NOT chest pain/cd   Vital Signs:  Patient profile:   45 year old male Height:      71 inches Weight:      220 pounds BMI:     30.79 O2 Sat:      97 % on Room air Temp:     98.3 degrees F oral Pulse rate:   74 / minute BP sitting:   124 / 84  (left arm) Cuff size:   large  Vitals Entered By: Bill Salinas CMA (May 19, 2009 3:53 PM)  O2 Flow:  Room air CC: pt here for evaluation of SOB x 2 weeks with chest discomfort/ ab   Primary Care Provider:  Jacques Navy MD  CC:  pt here for evaluation of SOB x 2 weeks with chest discomfort/ ab.  History of Present Illness: Patient is seen acutely for difficulty getting a deep breath. He has a feeling of air hunger and discomfort, diferent from his known reflux, in the center of his chest. He has had no limitations in his activity. He has no prior history of pulmonary disease. He has no risk factors for PE. He reports he can participate in vigorous activity without dyspnea yet at rest he will find that he takes deep breaths/sighs and feels he is not getting enough oxygen. He has had no syncope or near-syncope.  Current Medications (verified): 1)  Testosterone Cypionate 200 Mg/ml  Oil (Testosterone Cypionate) .Marland Kitchen.. 1cc Q 2 Wks 2)  Folic Acid 1 Mg  Tabs (Folic Acid) .... One By Mouth Once Daily 3)  One-Daily Multivitamins   Tabs (Multiple Vitamin) .... One By Mouth Once Daily  Allergies (verified): No Known Drug Allergies  Past History:  Past Medical History: Last updated: 12/15/2008 Hypogonadism Paroxysmal nocturnal hemoglobinuria - [facility] - dr. 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     Physical Exam  General:  WNWD white male in no distress Head:  Normocephalic and atraumatic without obvious abnormalities. No apparent alopecia or balding. Eyes:  pupils equal, pupils round, corneas and lenses clear, and no injection.   Neck:  supple.   Lungs:  normal respiratory effort, no intercostal retractions, no accessory muscle use, normal breath sounds, no crackles, and no wheezes.   Heart:  normal rate and regular rhythm.     Impression & Recommendations:  Problem # 1:  DYSPNEA (ICD-786.05) Normal respiratory exam. O2 sat 97%. Chest x-ray image and report reviewed: normal. Atypical symptoms. Suspect that anxiety may be playing a role.  Plan - no further pulmonary workup at this time           provided Rx for limited amount of lorazepam 0.5 to use as needed for dypsnea.  Complete Medication List: 1)  Testosterone Cypionate 200 Mg/ml Oil (Testosterone cypionate) .Marland Kitchen.. 1cc q 2 wks 2)  Folic Acid 1 Mg Tabs (Folic acid) .... One by mouth once daily 3)  One-daily Multivitamins Tabs (Multiple vitamin) .... One by mouth once daily 4)  Lorazepam 0.5 Mg Tabs (Lorazepam) .Marland Kitchen.. 1 by mouth q6 as needed dyspnea or anxiety Prescriptions: LORAZEPAM 0.5 MG TABS (LORAZEPAM) 1 by mouth q6 as needed dyspnea or anxiety  #10 x 0   Entered and Authorized by:  Jacques Navy MD   Signed by:   Jacques Navy MD on 05/21/2009   Method used:   Handwritten   RxID:   [id number]

## 2010-05-03 NOTE — Letter (Signed)
Summary: [facility] Interval [facility]  [facility]   Imported By: Sherian Rein 10/09/2009 14:01:14  _____________________________________________________________________  External Attachment:    Type:   Image     Comment:   External Document

## 2010-05-03 NOTE — Assessment & Plan Note (Signed)
Summary: TESTOSTERONE/ MEN /NWS  Nurse Visit   Allergies: No Known Drug Allergies  Medication Administration  Injection # 1:    Medication: Testosterone Cypionat 200mg  ing    Diagnosis: HYPOGONADISM (ICD-257.2)    Route: IM    Site: RUOQ gluteus    Exp Date: 10/31/2011    Lot #: [id number]    Mfr: Farmaceutica    Comments: Pt received 3/4 cc    Patient tolerated injection without complications    Given by: Ami Bullins CMA (March 30, 2010 9:41 AM)  Orders Added: 1)  Testosterone Cypionat 200mg  ing [J1080]

## 2010-05-03 NOTE — Assessment & Plan Note (Signed)
Summary: TESTOSTERONE/ MEN /NWS  Nurse Visit   Allergies: No Known Drug Allergies  Medication Administration  Injection # 1:    Medication: Testosterone Cypionat 200mg  ing    Diagnosis: HYPOGONADISM (ICD-257.2)    Route: IM    Site: RUOQ gluteus    Exp Date: 07/31/2011    Lot #: [id number]    Mfr: Claudette Laws    Patient tolerated injection without complications    Given by: Lucious Groves CMA (November 21, 2009 10:57 AM)  Orders Added: 1)  Testosterone Cypionat 200mg  ing [J1080] 2)  Admin of patients own med IM/SQ [[id number]]

## 2010-05-03 NOTE — Assessment & Plan Note (Signed)
Summary: testosterone/men/cd  Nurse Visit   Allergies: No Known Drug Allergies  Medication Administration  Injection # 1:    Medication: Testosterone Cypionat 200mg  ing    Diagnosis: HYPOGONADISM (ICD-257.2)    Route: IM    Site: RUOQ gluteus    Exp Date: 05/03/2011    Lot #: [id number]    Mfr: watson    Patient tolerated injection without complications    Given by: Lucious Groves (June 02, 2009 9:01 AM)  Orders Added: 1)  Testosterone Cypionat 200mg  ing [J1080] 2)  Admin of patients own med IM/SQ [[id number]]

## 2010-05-03 NOTE — Assessment & Plan Note (Signed)
Summary: test inj  per pt 2wk--men  stc  Nurse Visit   Vitals Entered By: Lamar Sprinkles, CMA (February 02, 2010 9:47 AM)  Allergies: No Known Drug Allergies  Medication Administration  Injection # 1:    Medication: Testosterone Cypionat 200mg  ing    Diagnosis: HYPOGONADISM (ICD-257.2)    Route: IM    Site: RUOQ gluteus    Exp Date: 10/31/2011    Lot #: [id number]    Comments: 3/4 CC (PER MD AT [facility])    Patient tolerated injection without complications    Given by: Lamar Sprinkles, CMA (February 02, 2010 9:48 AM)  Orders Added: 1)  Admin of patients own med IM/SQ [phone]
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
## 2010-05-03 NOTE — Letter (Signed)
Summary: [facility] Interval Note[facility]  [facility]   Imported By: Sherian Rein 10/31/2009 15:17:55  _____________________________________________________________________  External Attachment:    Type:   Image     Comment:   External Document

## 2010-05-03 NOTE — Assessment & Plan Note (Signed)
Summary: test shot/men/cd  Nurse Visit   Allergies: No Known Drug Allergies  Medication Administration  Injection # 1:    Medication: Testosterone Cypionat 200mg  ing    Diagnosis: HYPOGONADISM (ICD-257.2)    Route: IM    Site: RUOQ gluteus    Exp Date: 05/2012    Lot #: [id number]    Mfr: Claudette Laws    Comments: pt got 3/4 cc injection    Patient tolerated injection without complications    Given by: Ami Bullins CMA (April 27, 2010 10:03 AM)  Orders Added: 1)  Testosterone Cypionat 200mg  ing [J1080]

## 2010-05-03 NOTE — Assessment & Plan Note (Signed)
Summary: testorone / men / cd  Nurse Visit   Allergies: No Known Drug Allergies  Medication Administration  Injection # 1:    Medication: Testosterone Cypionat 200mg  ing    Diagnosis: HYPOGONADISM (ICD-257.2)    Route: IM    Site: LUOQ gluteus    Exp Date: 10/31/2011    Lot #: [id number]    Mfr: [employer]    Comments: 3/2 cc total    Patient tolerated injection without complications    Given by: Margaret Pyle, CMA (April 13, 2010 11:20 AM)  Orders Added: 1)  Admin of patients own med IM/SQ [phone]

## 2010-05-03 NOTE — Assessment & Plan Note (Signed)
Summary: (self vile)testosterone injection-lb--coming at 1pm  Nurse Visit   Allergies: No Known Drug Allergies  Medication Administration  Injection # 1:    Medication: Testosterone Cypionat 200mg  ing    Diagnosis: HYPOGONADISM (ICD-257.2)    Route: IM    Site: LUOQ gluteus    Exp Date: 10/31/2011    Lot #: [id number]    Mfr: Claudette Laws    Comments: Pt recieved 0.75 cc    Patient tolerated injection without complications    Given by: Margaret Pyle, CMA (February 19, 2010 1:50 PM)  Orders Added: 1)  Admin of patients own med IM/SQ [phone]

## 2010-05-03 NOTE — Assessment & Plan Note (Signed)
Summary: TESTOSTERONE/ MEN /NWS  Nurse Visit   Allergies: No Known Drug Allergies  Medication Administration  Injection # 1:    Medication: Testosterone Cypionat 200mg  ing    Diagnosis: HYPOGONADISM (ICD-257.2)    Route: IM    Site: RUOQ gluteus    Exp Date: 05/03/2011    Lot #: [id number]    Patient tolerated injection without complications    Given by: Lamar Sprinkles, CMA (May 19, 2009 9:59 AM)  Orders Added: 1)  Admin of patients own med IM/SQ [96372M]   Medication Administration  Injection # 1:    Medication: Testosterone Cypionat 200mg  ing    Diagnosis: HYPOGONADISM (ICD-257.2)    Route: IM    Site: RUOQ gluteus    Exp Date: 05/03/2011    Lot #: [id number]    Patient tolerated injection without complications    Given by: Lamar Sprinkles, CMA (May 19, 2009 9:59 AM)  Orders Added: 1)  Admin of patients own med IM/SQ [phone]

## 2010-05-03 NOTE — Letter (Signed)
Summary: [facility]  [facility]   Imported By: Lanelle Bal 01/19/2010 12:50:51  _____________________________________________________________________  External Attachment:    Type:   Image     Comment:   External Document

## 2010-05-03 NOTE — Assessment & Plan Note (Signed)
Summary: TESTOSTERONE/ MEN Natale Milch  Nurse Visit   Vitals Entered By: Lamar Sprinkles, CMA (September 08, 2009 2:04 PM)  Allergies: No Known Drug Allergies  Medication Administration  Injection # 1:    Medication: Testosterone Cypionat 200mg  ing    Diagnosis: HYPOGONADISM (ICD-257.2)    Route: IM    Site: LUOQ gluteus    Exp Date: 07/31/2011    Lot #: [id number]    Comments: 1 cc    Patient tolerated injection without complications    Given by: Lamar Sprinkles, CMA (September 08, 2009 2:05 PM)  Orders Added: 1)  Admin of patients own med IM/SQ [96372M]   Medication Administration  Injection # 1:    Medication: Testosterone Cypionat 200mg  ing    Diagnosis: HYPOGONADISM (ICD-257.2)    Route: IM    Site: LUOQ gluteus    Exp Date: 07/31/2011    Lot #: [id number]    Comments: 1 cc    Patient tolerated injection without complications    Given by: Lamar Sprinkles, CMA (September 08, 2009 2:05 PM)  Orders Added: 1)  Admin of patients own med IM/SQ [phone]

## 2010-05-11 ENCOUNTER — Encounter: Payer: Self-pay | Admitting: Internal Medicine

## 2010-05-11 ENCOUNTER — Ambulatory Visit (INDEPENDENT_AMBULATORY_CARE_PROVIDER_SITE_OTHER): Payer: PRIVATE HEALTH INSURANCE

## 2010-05-11 DIAGNOSIS — E291 Testicular hypofunction: Secondary | ICD-10-CM

## 2010-05-17 NOTE — Assessment & Plan Note (Signed)
Summary: testosterone shot/men/cd  Nurse Visit   Allergies: No Known Drug Allergies  Medication Administration  Injection # 1:    Medication: Testosterone Cypionat 200mg  ing    Diagnosis: HYPOGONADISM (ICD-257.2)    Comments: 3/4 cc    Patient tolerated injection without complications    Given by: Lamar Sprinkles, CMA (May 11, 2010 10:13 AM)  Orders Added: 1)  Admin of patients own med IM/SQ [96372M]   Medication Administration  Injection # 1:    Medication: Testosterone Cypionat 200mg  ing    Diagnosis: HYPOGONADISM (ICD-257.2)    Comments: 3/4 cc    Patient tolerated injection without complications    Given by: Lamar Sprinkles, CMA (May 11, 2010 10:13 AM)  Orders Added: 1)  Admin of patients own med IM/SQ [phone]

## 2010-05-25 ENCOUNTER — Ambulatory Visit (INDEPENDENT_AMBULATORY_CARE_PROVIDER_SITE_OTHER): Payer: PRIVATE HEALTH INSURANCE

## 2010-05-25 ENCOUNTER — Encounter: Payer: Self-pay | Admitting: Internal Medicine

## 2010-05-25 DIAGNOSIS — E291 Testicular hypofunction: Secondary | ICD-10-CM

## 2010-05-29 NOTE — Assessment & Plan Note (Signed)
Summary: TESTOSTERONE INJ / MEN /NWS  Nurse Visit   Allergies: No Known Drug Allergies  Medication Administration  Injection # 1:    Medication: Testosterone Cypionat 200mg  ing    Diagnosis: HYPOGONADISM (ICD-257.2)    Route: IM    Site: RUOQ gluteus    Exp Date: 05/2012    Lot #: [id number]    Comments: 3/4cc    Patient tolerated injection without complications    Given by: Lamar Sprinkles, CMA (May 25, 2010 10:39 AM)  Orders Added: 1)  Admin of patients own med IM/SQ [96372M]   Medication Administration  Injection # 1:    Medication: Testosterone Cypionat 200mg  ing    Diagnosis: HYPOGONADISM (ICD-257.2)    Route: IM    Site: RUOQ gluteus    Exp Date: 05/2012    Lot #: [id number]    Comments: 3/4cc    Patient tolerated injection without complications    Given by: Lamar Sprinkles, CMA (May 25, 2010 10:39 AM)  Orders Added: 1)  Admin of patients own med IM/SQ [phone]

## 2010-05-31 ENCOUNTER — Encounter: Payer: Self-pay | Admitting: Internal Medicine

## 2010-06-08 ENCOUNTER — Encounter: Payer: Self-pay | Admitting: Internal Medicine

## 2010-06-08 ENCOUNTER — Ambulatory Visit: Payer: PRIVATE HEALTH INSURANCE

## 2010-06-08 ENCOUNTER — Ambulatory Visit (INDEPENDENT_AMBULATORY_CARE_PROVIDER_SITE_OTHER): Payer: PRIVATE HEALTH INSURANCE

## 2010-06-08 DIAGNOSIS — E291 Testicular hypofunction: Secondary | ICD-10-CM

## 2010-06-15 ENCOUNTER — Ambulatory Visit: Payer: PRIVATE HEALTH INSURANCE

## 2010-06-19 NOTE — Assessment & Plan Note (Signed)
Summary: testosterone shot/men  Nurse Visit   Allergies: No Known Drug Allergies  Medication Administration  Injection # 1:    Medication: Testosterone Cypionat 200mg  ing    Route: IM    Site: LUOQ gluteus    Comments: 3/4cc    Patient tolerated injection without complications    Given by: Lamar Sprinkles, CMA (June 14, 2010 5:17 PM)  Orders Added: 1)  Admin of patients own med IM/SQ [96372M]    Medication Administration  Injection # 1:    Medication: Testosterone Cypionat 200mg  ing    Route: IM    Site: LUOQ gluteus    Comments: 3/4cc    Patient tolerated injection without complications    Given by: Lamar Sprinkles, CMA (June 14, 2010 5:17 PM)  Orders Added: 1)  Admin of patients own med IM/SQ [phone]

## 2010-06-19 NOTE — Letter (Signed)
Summary: [facility] Interval Note[facility]  [facility] Interval Note/[facility]   Imported By: Sherian Rein 06/13/2010 11:33:57  _____________________________________________________________________  External Attachment:    Type:   Image     Comment:   External Document

## 2010-06-22 ENCOUNTER — Ambulatory Visit (INDEPENDENT_AMBULATORY_CARE_PROVIDER_SITE_OTHER): Payer: PRIVATE HEALTH INSURANCE | Admitting: *Deleted

## 2010-06-22 DIAGNOSIS — E291 Testicular hypofunction: Secondary | ICD-10-CM

## 2010-06-22 MED ORDER — TESTOSTERONE CYPIONATE 200 MG/ML IM SOLN
150.0000 mg | INTRAMUSCULAR | Status: AC
  Administered 2010-06-22 – 2012-04-24 (×4): 150 mg via INTRAMUSCULAR

## 2010-07-05 ENCOUNTER — Ambulatory Visit (INDEPENDENT_AMBULATORY_CARE_PROVIDER_SITE_OTHER): Payer: PRIVATE HEALTH INSURANCE | Admitting: *Deleted

## 2010-07-05 DIAGNOSIS — E291 Testicular hypofunction: Secondary | ICD-10-CM

## 2010-07-20 ENCOUNTER — Ambulatory Visit: Payer: PRIVATE HEALTH INSURANCE | Admitting: *Deleted

## 2010-07-20 DIAGNOSIS — E291 Testicular hypofunction: Secondary | ICD-10-CM

## 2010-07-20 MED ORDER — TESTOSTERONE CYPIONATE 200 MG/ML IM SOLN
200.0000 mg | INTRAMUSCULAR | Status: AC
  Administered 2010-07-20 – 2010-08-03 (×2): 150 mg via INTRAMUSCULAR

## 2010-08-03 ENCOUNTER — Ambulatory Visit (INDEPENDENT_AMBULATORY_CARE_PROVIDER_SITE_OTHER): Payer: PRIVATE HEALTH INSURANCE | Admitting: *Deleted

## 2010-08-03 DIAGNOSIS — E291 Testicular hypofunction: Secondary | ICD-10-CM

## 2010-08-03 MED ORDER — TESTOSTERONE CYPIONATE 200 MG/ML IM SOLN: 200.0000 mg | INTRAMUSCULAR | Status: AC

## 2010-08-14 NOTE — Letter (Signed)
June 30, 2009    To Whom It May Concern   RE:  Brian Holden, Brian Holden  MRN:  [id number]  /  DOB:  12-07-1965   Dear Brian Holden:   Mr. Brian Holden, a 45 year old gentleman, is followed for routine  medical care in my internal medicine practice.   The patient was last seen in the office on May 19, 2009.   This letter is to certify that the patient was found to be in good  health with no active medical problems at this time.  Specifically, he  has had no GI symptoms or problems.   If I can provide any additional information, I would be happy to do so  once I receive a HIPAA release form to be able to release specific  medical information.   I remain yours truly,    Sincerely,      Rosalyn Gess. [place], MD  Electronically Signed    MEN/MedQ  DD: 06/30/2009  DT: 06/30/2009  Job #: [id number]

## 2010-08-17 ENCOUNTER — Ambulatory Visit: Payer: PRIVATE HEALTH INSURANCE

## 2010-08-21 ENCOUNTER — Ambulatory Visit (INDEPENDENT_AMBULATORY_CARE_PROVIDER_SITE_OTHER): Payer: PRIVATE HEALTH INSURANCE | Admitting: *Deleted

## 2010-08-21 ENCOUNTER — Telehealth: Payer: Self-pay | Admitting: *Deleted

## 2010-08-21 DIAGNOSIS — E291 Testicular hypofunction: Secondary | ICD-10-CM

## 2010-08-21 MED ORDER — TESTOSTERONE CYPIONATE 200 MG/ML IM SOLN
350.0000 mg | Freq: Once | INTRAMUSCULAR | Status: AC
  Administered 2010-08-21: 350 mg via INTRAMUSCULAR

## 2010-08-21 NOTE — Telephone Encounter (Signed)
Patient requesting referral to GI, says [facility] has sent records from last OV (they are scanned into centricity dated 05/31/2010). He continues to c/o abd discomfort and GI referral was suggested by MD at [facility]. He would like to know if Dr Debby Bud would be willing to refer w/o additional OV. He has never seen GI and does not have anyone in mind.

## 2010-08-22 DIAGNOSIS — R109 Unspecified abdominal pain: Secondary | ICD-10-CM | POA: Insufficient documentation

## 2010-08-22 NOTE — Telephone Encounter (Signed)
Referral request entered

## 2010-08-31 ENCOUNTER — Ambulatory Visit (INDEPENDENT_AMBULATORY_CARE_PROVIDER_SITE_OTHER): Payer: PRIVATE HEALTH INSURANCE | Admitting: *Deleted

## 2010-08-31 DIAGNOSIS — E291 Testicular hypofunction: Secondary | ICD-10-CM

## 2010-09-04 ENCOUNTER — Encounter: Payer: Self-pay | Admitting: Internal Medicine

## 2010-09-04 MED ORDER — TESTOSTERONE CYPIONATE 200 MG/ML IM SOLN: 75.0000 mg | Freq: Once | INTRAMUSCULAR | Status: AC

## 2010-09-14 ENCOUNTER — Ambulatory Visit (INDEPENDENT_AMBULATORY_CARE_PROVIDER_SITE_OTHER): Payer: PRIVATE HEALTH INSURANCE | Admitting: *Deleted

## 2010-09-14 DIAGNOSIS — E291 Testicular hypofunction: Secondary | ICD-10-CM

## 2010-09-14 MED ORDER — TESTOSTERONE CYPIONATE 200 MG/ML IM SOLN
76.0000 mg | Freq: Once | INTRAMUSCULAR | Status: AC
  Administered 2010-09-14: 76 mg via INTRAMUSCULAR

## 2010-09-28 ENCOUNTER — Ambulatory Visit: Payer: PRIVATE HEALTH INSURANCE | Admitting: *Deleted

## 2010-09-28 ENCOUNTER — Ambulatory Visit: Payer: PRIVATE HEALTH INSURANCE

## 2010-09-28 DIAGNOSIS — E291 Testicular hypofunction: Secondary | ICD-10-CM

## 2010-09-28 MED ORDER — TESTOSTERONE ENANTHATE 200 MG/ML IM SOLN
200.0000 mg | INTRAMUSCULAR | Status: AC
  Administered 2010-09-28: 200 mg via INTRAMUSCULAR

## 2010-10-10 ENCOUNTER — Encounter: Payer: Self-pay | Admitting: *Deleted

## 2010-10-12 ENCOUNTER — Ambulatory Visit: Payer: PRIVATE HEALTH INSURANCE

## 2010-10-15 ENCOUNTER — Ambulatory Visit (INDEPENDENT_AMBULATORY_CARE_PROVIDER_SITE_OTHER): Payer: PRIVATE HEALTH INSURANCE | Admitting: Internal Medicine

## 2010-10-15 ENCOUNTER — Ambulatory Visit (INDEPENDENT_AMBULATORY_CARE_PROVIDER_SITE_OTHER): Payer: PRIVATE HEALTH INSURANCE | Admitting: *Deleted

## 2010-10-15 ENCOUNTER — Encounter: Payer: Self-pay | Admitting: Internal Medicine

## 2010-10-15 DIAGNOSIS — E291 Testicular hypofunction: Secondary | ICD-10-CM

## 2010-10-15 DIAGNOSIS — K219 Gastro-esophageal reflux disease without esophagitis: Secondary | ICD-10-CM

## 2010-10-15 DIAGNOSIS — K589 Irritable bowel syndrome without diarrhea: Secondary | ICD-10-CM

## 2010-10-15 MED ORDER — TESTOSTERONE CYPIONATE 200 MG/ML IM SOLN
150.0000 mg | Freq: Once | INTRAMUSCULAR | Status: AC
  Administered 2010-10-15: 150 mg via INTRAMUSCULAR

## 2010-10-15 NOTE — Progress Notes (Signed)
HISTORY OF PRESENT ILLNESS:  Brian Holden is a 45 y.o. male with paroxysmal nocturnal hemoglobinuria (followed at [facility]), male hypogonadism, and kidney stones. He presents today with a number of GI complaints, but with a particular concern about GERD. He is rambling historian without particular focus. He dates his problems back to ventral hernia repair in 2010. Complaints include intermittent sore throat, a globus type sensation, abdominal soreness with exercise, and intermittent upper back pain. He also reports prior problems with alternating bowel habits which he states improved after discontinuing Metamucil. He denies true pyrosis. There is a family history of ulcers. For complaints of right-sided chest wall pain radiating into the back as well as right-sided abdominal and pelvic pain extending into the groin, he underwent non contrast enhanced CT scan of the chest, abdomen, and pelvis. Examination was negative except for small midline periumbilical hernia with fat and non obstructing left renal calculus. Abdominal ultrasound in 2008 revealed gallstones, normal liver, and mild splenomegaly. Labs from [place] in June of 2011 refill unremarkable comprehensive metabolic panel, including normal liver tests. CBC revealing hemoglobin of 18.4, white blood cell count 4.5, platelets 135,000.  REVIEW OF SYSTEMS:  All non-GI ROS negative except for back pain, cough, shortness of breath, sore throat  Past Medical History  Diagnosis Date  . History of gallstones   . Paroxysmal nocturnal hemoglobinuria   . Male hypogonadism   . Anemia   . Kidney stones     Past Surgical History  Procedure Date  . Hernia repair     Social History [name]  reports that he has never smoked. He has quit using smokeless tobacco. He reports that he does not drink alcohol or use illicit drugs.  family history includes Ovarian cancer in an unspecified family member.  Allergies not on file     PHYSICAL  EXAMINATION: Vital signs: BP 118/82  Pulse 80  Ht 6' (1.829 m)  Wt 223 lb (101.152 kg)  BMI 30.24 kg/m2  Constitutional: generally well-appearing, no acute distress Psychiatric: alert and oriented x3, cooperative Eyes: extraocular movements intact, anicteric, conjunctiva pink Mouth: oral pharynx moist, no lesions Neck: supple no lymphadenopathy Cardiovascular: heart regular rate and rhythm, no murmur Lungs: clear to auscultation bilaterally Abdomen: soft, mild muscular wall tenderness in the lower abdomen, nondistended, no obvious ascites, no peritoneal signs, normal bowel sounds, no organomegaly. Stria  Extremities: no lower extremity edema bilaterally Skin: no lesions on visible extremities Neuro: No focal deficits.   ASSESSMENT:  #1. Globus sensation, intermittent sore throat, and upper back pain. Unclear if this is secondary to GERD. #2. History of alternating bowel habits. Suggestion of irritable bowel #3. Known cholelithiasis. Cannot implicate his symptoms as being gallbladder related at this point. Continued observation #4. [facility]   PLAN:  #1. Empiric trial of Dexilant 60 mg daily #2. Diagnostic upper endoscopy.The nature of the procedure, as well as the risks, benefits, and alternatives were carefully and thoroughly reviewed with the patient. Ample time for discussion and questions allowed. The patient understood, was satisfied, and agreed to proceed.

## 2010-10-15 NOTE — Patient Instructions (Signed)
EGD [facility] 10/26/10 8:00 am arrive at 7:30 am 4th floor EGD Brochure given for you to review. Dexilant samples given for you to take 1 daily 30 minutes prior to first meal.

## 2010-10-26 ENCOUNTER — Other Ambulatory Visit: Payer: PRIVATE HEALTH INSURANCE | Admitting: Internal Medicine

## 2010-10-29 ENCOUNTER — Ambulatory Visit (INDEPENDENT_AMBULATORY_CARE_PROVIDER_SITE_OTHER): Payer: PRIVATE HEALTH INSURANCE | Admitting: *Deleted

## 2010-10-29 DIAGNOSIS — E291 Testicular hypofunction: Secondary | ICD-10-CM

## 2010-10-29 MED ORDER — TESTOSTERONE ENANTHATE 200 MG/ML IM SOLN
150.0000 mg | INTRAMUSCULAR | Status: AC
  Administered 2010-10-29: 150 mg via INTRAMUSCULAR

## 2010-11-09 ENCOUNTER — Ambulatory Visit (INDEPENDENT_AMBULATORY_CARE_PROVIDER_SITE_OTHER): Payer: PRIVATE HEALTH INSURANCE | Admitting: *Deleted

## 2010-11-09 DIAGNOSIS — E291 Testicular hypofunction: Secondary | ICD-10-CM

## 2010-11-09 MED ORDER — TESTOSTERONE CYPIONATE 100 MG/ML IM SOLN
150.0000 mg | Freq: Once | INTRAMUSCULAR | Status: AC
  Administered 2010-11-09: 150 mg via INTRAMUSCULAR

## 2010-11-23 ENCOUNTER — Ambulatory Visit (INDEPENDENT_AMBULATORY_CARE_PROVIDER_SITE_OTHER): Payer: PRIVATE HEALTH INSURANCE

## 2010-11-23 DIAGNOSIS — E291 Testicular hypofunction: Secondary | ICD-10-CM

## 2010-11-23 MED ORDER — TESTOSTERONE CYPIONATE 200 MG/ML IM SOLN
175.0000 mg | Freq: Once | INTRAMUSCULAR | Status: AC
  Administered 2010-11-23: 175 mg via INTRAMUSCULAR

## 2010-12-07 ENCOUNTER — Ambulatory Visit (INDEPENDENT_AMBULATORY_CARE_PROVIDER_SITE_OTHER): Payer: PRIVATE HEALTH INSURANCE

## 2010-12-07 DIAGNOSIS — E291 Testicular hypofunction: Secondary | ICD-10-CM

## 2010-12-07 MED ORDER — TESTOSTERONE CYPIONATE 200 MG/ML IM SOLN
150.0000 mg | Freq: Once | INTRAMUSCULAR | Status: AC
  Administered 2010-12-07: 150 mg via INTRAMUSCULAR

## 2010-12-21 ENCOUNTER — Ambulatory Visit (INDEPENDENT_AMBULATORY_CARE_PROVIDER_SITE_OTHER): Payer: PRIVATE HEALTH INSURANCE | Admitting: *Deleted

## 2010-12-21 DIAGNOSIS — E291 Testicular hypofunction: Secondary | ICD-10-CM

## 2010-12-21 MED ORDER — TESTOSTERONE CYPIONATE 200 MG/ML IM SOLN
150.0000 mg | Freq: Once | INTRAMUSCULAR | Status: AC
  Administered 2010-12-21: 150 mg via INTRAMUSCULAR

## 2010-12-23 ENCOUNTER — Emergency Department (HOSPITAL_COMMUNITY)
Admission: EM | Admit: 2010-12-23 | Discharge: 2010-12-23 | Disposition: A | Discharge status: Home / Self Care | Payer: PRIVATE HEALTH INSURANCE | Attending: Emergency Medicine | Admitting: Emergency Medicine

## 2010-12-23 ENCOUNTER — Emergency Department (HOSPITAL_COMMUNITY): Payer: PRIVATE HEALTH INSURANCE

## 2010-12-23 DIAGNOSIS — K219 Gastro-esophageal reflux disease without esophagitis: Secondary | ICD-10-CM | POA: Insufficient documentation

## 2010-12-23 DIAGNOSIS — R1032 Left lower quadrant pain: Secondary | ICD-10-CM | POA: Insufficient documentation

## 2010-12-23 DIAGNOSIS — N200 Calculus of kidney: Secondary | ICD-10-CM | POA: Insufficient documentation

## 2010-12-23 DIAGNOSIS — K8020 Calculus of gallbladder without cholecystitis without obstruction: Secondary | ICD-10-CM | POA: Insufficient documentation

## 2010-12-23 LAB — URINALYSIS, ROUTINE W REFLEX MICROSCOPIC
Ketones, ur: NEGATIVE mg/dL
Leukocytes, UA: NEGATIVE
Nitrite: NEGATIVE
Protein, ur: NEGATIVE mg/dL
Urobilinogen, UA: 0.2 mg/dL (ref 0.0–1.0)

## 2010-12-23 LAB — DIFFERENTIAL
Basophils Absolute: 0 10*3/uL (ref 0.0–0.1)
Basophils Relative: 0 % (ref 0–1)
Eosinophils Absolute: 0.1 10*3/uL (ref 0.0–0.7)
Eosinophils Relative: 1 % (ref 0–5)
Monocytes Absolute: 0.5 10*3/uL (ref 0.1–1.0)

## 2010-12-23 LAB — CBC
Hemoglobin: 17.2 g/dL — ABNORMAL HIGH (ref 13.0–17.0)
MCH: 33.6 pg (ref 26.0–34.0)
MCV: 93.4 fL (ref 78.0–100.0)
RBC: 5.12 MIL/uL (ref 4.22–5.81)

## 2010-12-23 LAB — LIPASE, BLOOD: Lipase: 22 U/L (ref 11–59)

## 2010-12-23 LAB — BASIC METABOLIC PANEL
CO2: 31 mEq/L (ref 19–32)
Calcium: 9.5 mg/dL (ref 8.4–10.5)
Creatinine, Ser: 0.81 mg/dL (ref 0.50–1.35)
Glucose, Bld: 127 mg/dL — ABNORMAL HIGH (ref 70–99)
Sodium: 136 mEq/L (ref 135–145)

## 2010-12-23 LAB — HEPATIC FUNCTION PANEL
Albumin: 4.4 g/dL (ref 3.5–5.2)
Alkaline Phosphatase: 70 U/L (ref 39–117)
Total Bilirubin: 0.6 mg/dL (ref 0.3–1.2)

## 2010-12-23 MED ORDER — IOHEXOL 300 MG/ML  SOLN
100.0000 mL | Freq: Once | INTRAMUSCULAR | Status: AC | PRN
  Administered 2010-12-23: 100 mL via INTRAVENOUS

## 2011-01-07 ENCOUNTER — Ambulatory Visit: Payer: PRIVATE HEALTH INSURANCE | Admitting: *Deleted

## 2011-01-07 DIAGNOSIS — E291 Testicular hypofunction: Secondary | ICD-10-CM

## 2011-01-18 ENCOUNTER — Ambulatory Visit: Payer: PRIVATE HEALTH INSURANCE

## 2011-01-21 ENCOUNTER — Ambulatory Visit (INDEPENDENT_AMBULATORY_CARE_PROVIDER_SITE_OTHER): Payer: PRIVATE HEALTH INSURANCE | Admitting: *Deleted

## 2011-01-21 DIAGNOSIS — E291 Testicular hypofunction: Secondary | ICD-10-CM

## 2011-01-21 MED ORDER — TESTOSTERONE CYPIONATE 200 MG/ML IM SOLN
150.0000 mg | Freq: Once | INTRAMUSCULAR | Status: AC
  Administered 2011-01-21: 150 mg via INTRAMUSCULAR

## 2011-02-01 ENCOUNTER — Ambulatory Visit (INDEPENDENT_AMBULATORY_CARE_PROVIDER_SITE_OTHER): Payer: PRIVATE HEALTH INSURANCE

## 2011-02-01 DIAGNOSIS — E291 Testicular hypofunction: Secondary | ICD-10-CM

## 2011-02-01 MED ORDER — TESTOSTERONE ENANTHATE 200 MG/ML IM SOLN: 200.0000 mg | INTRAMUSCULAR | Status: DC

## 2011-02-15 ENCOUNTER — Ambulatory Visit: Payer: PRIVATE HEALTH INSURANCE

## 2011-02-18 ENCOUNTER — Ambulatory Visit (INDEPENDENT_AMBULATORY_CARE_PROVIDER_SITE_OTHER): Payer: PRIVATE HEALTH INSURANCE | Admitting: *Deleted

## 2011-02-18 DIAGNOSIS — E291 Testicular hypofunction: Secondary | ICD-10-CM

## 2011-02-18 MED ORDER — TESTOSTERONE CYPIONATE 200 MG/ML IM SOLN
150.0000 mg | Freq: Once | INTRAMUSCULAR | Status: AC
  Administered 2011-02-18: 150 mg via INTRAMUSCULAR

## 2011-02-28 ENCOUNTER — Ambulatory Visit (INDEPENDENT_AMBULATORY_CARE_PROVIDER_SITE_OTHER): Payer: PRIVATE HEALTH INSURANCE | Admitting: *Deleted

## 2011-02-28 DIAGNOSIS — E291 Testicular hypofunction: Secondary | ICD-10-CM

## 2011-02-28 MED ORDER — TESTOSTERONE CYPIONATE 200 MG/ML IM SOLN
150.0000 mg | INTRAMUSCULAR | Status: AC
  Administered 2011-02-28 – 2011-05-24 (×4): 150 mg via INTRAMUSCULAR

## 2011-03-15 ENCOUNTER — Ambulatory Visit (INDEPENDENT_AMBULATORY_CARE_PROVIDER_SITE_OTHER): Payer: PRIVATE HEALTH INSURANCE

## 2011-03-15 DIAGNOSIS — E291 Testicular hypofunction: Secondary | ICD-10-CM

## 2011-03-29 ENCOUNTER — Ambulatory Visit: Payer: PRIVATE HEALTH INSURANCE

## 2011-03-29 DIAGNOSIS — E291 Testicular hypofunction: Secondary | ICD-10-CM

## 2011-04-01 MED ORDER — TESTOSTERONE CYPIONATE 200 MG/ML IM SOLN: 200.0000 mg | INTRAMUSCULAR | Status: AC

## 2011-04-12 ENCOUNTER — Ambulatory Visit (INDEPENDENT_AMBULATORY_CARE_PROVIDER_SITE_OTHER): Payer: PRIVATE HEALTH INSURANCE | Admitting: *Deleted

## 2011-04-12 DIAGNOSIS — E291 Testicular hypofunction: Secondary | ICD-10-CM

## 2011-04-15 MED ORDER — TESTOSTERONE CYPIONATE 200 MG/ML IM SOLN
200.0000 mg | Freq: Once | INTRAMUSCULAR | Status: AC
  Administered 2011-04-15: 200 mg via INTRAMUSCULAR

## 2011-04-29 ENCOUNTER — Ambulatory Visit (INDEPENDENT_AMBULATORY_CARE_PROVIDER_SITE_OTHER): Payer: PRIVATE HEALTH INSURANCE

## 2011-04-29 DIAGNOSIS — E291 Testicular hypofunction: Secondary | ICD-10-CM

## 2011-05-10 ENCOUNTER — Ambulatory Visit (INDEPENDENT_AMBULATORY_CARE_PROVIDER_SITE_OTHER): Payer: PRIVATE HEALTH INSURANCE | Admitting: *Deleted

## 2011-05-10 DIAGNOSIS — E291 Testicular hypofunction: Secondary | ICD-10-CM

## 2011-05-10 MED ORDER — TESTOSTERONE ENANTHATE 200 MG/ML IM SOLN
150.0000 mg | INTRAMUSCULAR | Status: AC
  Administered 2011-05-10: 150 mg via INTRAMUSCULAR

## 2011-05-24 ENCOUNTER — Ambulatory Visit (INDEPENDENT_AMBULATORY_CARE_PROVIDER_SITE_OTHER): Payer: PRIVATE HEALTH INSURANCE

## 2011-05-24 DIAGNOSIS — E291 Testicular hypofunction: Secondary | ICD-10-CM

## 2011-06-07 ENCOUNTER — Ambulatory Visit (INDEPENDENT_AMBULATORY_CARE_PROVIDER_SITE_OTHER): Payer: PRIVATE HEALTH INSURANCE | Admitting: *Deleted

## 2011-06-07 DIAGNOSIS — E291 Testicular hypofunction: Secondary | ICD-10-CM

## 2011-06-07 MED ORDER — TESTOSTERONE ENANTHATE 200 MG/ML IM SOLN
150.0000 mg | INTRAMUSCULAR | Status: AC
  Administered 2011-06-07: 150 mg via INTRAMUSCULAR

## 2011-06-21 ENCOUNTER — Ambulatory Visit (INDEPENDENT_AMBULATORY_CARE_PROVIDER_SITE_OTHER): Payer: PRIVATE HEALTH INSURANCE

## 2011-06-21 DIAGNOSIS — E291 Testicular hypofunction: Secondary | ICD-10-CM

## 2011-06-21 MED ORDER — TESTOSTERONE ENANTHATE 200 MG/ML IM SOLN
150.0000 mg | INTRAMUSCULAR | Status: AC
  Administered 2011-06-21: 150 mg via INTRAMUSCULAR

## 2011-07-05 ENCOUNTER — Ambulatory Visit (INDEPENDENT_AMBULATORY_CARE_PROVIDER_SITE_OTHER): Payer: PRIVATE HEALTH INSURANCE | Admitting: *Deleted

## 2011-07-05 DIAGNOSIS — E291 Testicular hypofunction: Secondary | ICD-10-CM

## 2011-07-05 MED ORDER — TESTOSTERONE CYPIONATE 200 MG/ML IM SOLN
150.0000 mg | INTRAMUSCULAR | Status: AC
  Administered 2011-07-05 – 2011-08-16 (×4): 150 mg via INTRAMUSCULAR

## 2011-07-19 ENCOUNTER — Ambulatory Visit (INDEPENDENT_AMBULATORY_CARE_PROVIDER_SITE_OTHER): Payer: PRIVATE HEALTH INSURANCE | Admitting: *Deleted

## 2011-07-19 DIAGNOSIS — E291 Testicular hypofunction: Secondary | ICD-10-CM

## 2011-07-19 MED ORDER — TESTOSTERONE ENANTHATE 200 MG/ML IM SOLN: 150.0000 mg | Freq: Once | INTRAMUSCULAR | Status: AC

## 2011-07-19 MED ORDER — TESTOSTERONE ENANTHATE 200 MG/ML IM SOLN: 150.0000 mg | Freq: Once | INTRAMUSCULAR | Status: DC

## 2011-08-02 ENCOUNTER — Ambulatory Visit (INDEPENDENT_AMBULATORY_CARE_PROVIDER_SITE_OTHER): Payer: PRIVATE HEALTH INSURANCE | Admitting: *Deleted

## 2011-08-02 DIAGNOSIS — E291 Testicular hypofunction: Secondary | ICD-10-CM

## 2011-08-16 ENCOUNTER — Ambulatory Visit (INDEPENDENT_AMBULATORY_CARE_PROVIDER_SITE_OTHER): Payer: PRIVATE HEALTH INSURANCE | Admitting: *Deleted

## 2011-08-16 DIAGNOSIS — E291 Testicular hypofunction: Secondary | ICD-10-CM

## 2011-08-16 MED ORDER — TESTOSTERONE CYPIONATE 200 MG/ML IM SOLN
150.0000 mg | INTRAMUSCULAR | Status: AC
  Administered 2011-08-30 – 2012-04-10 (×5): 150 mg via INTRAMUSCULAR

## 2011-08-30 ENCOUNTER — Ambulatory Visit: Payer: PRIVATE HEALTH INSURANCE | Admitting: *Deleted

## 2011-08-30 DIAGNOSIS — E291 Testicular hypofunction: Secondary | ICD-10-CM

## 2011-08-30 MED ORDER — TESTOSTERONE CYPIONATE 200 MG/ML IM SOLN: 75.0000 mg | Freq: Once | INTRAMUSCULAR | Status: AC

## 2011-09-13 ENCOUNTER — Ambulatory Visit (INDEPENDENT_AMBULATORY_CARE_PROVIDER_SITE_OTHER): Payer: PRIVATE HEALTH INSURANCE | Admitting: *Deleted

## 2011-09-13 DIAGNOSIS — E291 Testicular hypofunction: Secondary | ICD-10-CM

## 2011-09-13 MED ORDER — TESTOSTERONE CYPIONATE 200 MG/ML IM SOLN
200.0000 mg | Freq: Once | INTRAMUSCULAR | Status: AC
  Administered 2012-07-03: 200 mg via INTRAMUSCULAR

## 2011-09-27 ENCOUNTER — Ambulatory Visit (INDEPENDENT_AMBULATORY_CARE_PROVIDER_SITE_OTHER): Payer: PRIVATE HEALTH INSURANCE

## 2011-09-27 DIAGNOSIS — E291 Testicular hypofunction: Secondary | ICD-10-CM

## 2011-09-27 MED ORDER — TESTOSTERONE CYPIONATE 200 MG/ML IM SOLN
150.0000 mg | Freq: Once | INTRAMUSCULAR | Status: AC
  Administered 2011-09-27: 150 mg via INTRAMUSCULAR

## 2011-10-11 ENCOUNTER — Ambulatory Visit (INDEPENDENT_AMBULATORY_CARE_PROVIDER_SITE_OTHER): Payer: PRIVATE HEALTH INSURANCE

## 2011-10-11 DIAGNOSIS — E291 Testicular hypofunction: Secondary | ICD-10-CM

## 2011-10-11 MED ORDER — TESTOSTERONE CYPIONATE 200 MG/ML IM SOLN
150.0000 mg | Freq: Once | INTRAMUSCULAR | Status: AC
  Administered 2011-10-11: 150 mg via INTRAMUSCULAR

## 2011-10-25 ENCOUNTER — Ambulatory Visit (INDEPENDENT_AMBULATORY_CARE_PROVIDER_SITE_OTHER): Payer: PRIVATE HEALTH INSURANCE

## 2011-10-25 DIAGNOSIS — E291 Testicular hypofunction: Secondary | ICD-10-CM

## 2011-10-25 MED ORDER — TESTOSTERONE CYPIONATE 200 MG/ML IM SOLN: 200.0000 mg | Freq: Once | INTRAMUSCULAR | Status: AC

## 2011-11-08 ENCOUNTER — Ambulatory Visit (INDEPENDENT_AMBULATORY_CARE_PROVIDER_SITE_OTHER): Payer: PRIVATE HEALTH INSURANCE

## 2011-11-08 DIAGNOSIS — E291 Testicular hypofunction: Secondary | ICD-10-CM

## 2011-11-22 ENCOUNTER — Ambulatory Visit (INDEPENDENT_AMBULATORY_CARE_PROVIDER_SITE_OTHER): Payer: PRIVATE HEALTH INSURANCE

## 2011-11-22 DIAGNOSIS — E291 Testicular hypofunction: Secondary | ICD-10-CM

## 2011-11-22 MED ORDER — TESTOSTERONE CYPIONATE 200 MG/ML IM SOLN
150.0000 mg | Freq: Once | INTRAMUSCULAR | Status: AC
  Administered 2011-11-22: 150 mg via INTRAMUSCULAR

## 2011-12-06 ENCOUNTER — Ambulatory Visit (INDEPENDENT_AMBULATORY_CARE_PROVIDER_SITE_OTHER): Payer: PRIVATE HEALTH INSURANCE

## 2011-12-06 DIAGNOSIS — E291 Testicular hypofunction: Secondary | ICD-10-CM

## 2011-12-06 MED ORDER — TESTOSTERONE CYPIONATE 200 MG/ML IM SOLN
150.0000 mg | Freq: Once | INTRAMUSCULAR | Status: AC
  Administered 2011-12-06: 150 mg via INTRAMUSCULAR

## 2011-12-23 ENCOUNTER — Ambulatory Visit: Payer: PRIVATE HEALTH INSURANCE

## 2011-12-24 ENCOUNTER — Ambulatory Visit (INDEPENDENT_AMBULATORY_CARE_PROVIDER_SITE_OTHER): Payer: PRIVATE HEALTH INSURANCE

## 2011-12-24 DIAGNOSIS — E291 Testicular hypofunction: Secondary | ICD-10-CM

## 2011-12-24 MED ORDER — TESTOSTERONE CYPIONATE 200 MG/ML IM SOLN
150.0000 mg | Freq: Once | INTRAMUSCULAR | Status: AC
  Administered 2011-12-24: 150 mg via INTRAMUSCULAR

## 2012-01-03 ENCOUNTER — Ambulatory Visit (INDEPENDENT_AMBULATORY_CARE_PROVIDER_SITE_OTHER): Payer: PRIVATE HEALTH INSURANCE

## 2012-01-03 DIAGNOSIS — E291 Testicular hypofunction: Secondary | ICD-10-CM

## 2012-01-03 MED ORDER — TESTOSTERONE CYPIONATE 200 MG/ML IM SOLN
150.0000 mg | Freq: Once | INTRAMUSCULAR | Status: AC
  Administered 2012-01-03: 150 mg via INTRAMUSCULAR

## 2012-01-17 ENCOUNTER — Ambulatory Visit (INDEPENDENT_AMBULATORY_CARE_PROVIDER_SITE_OTHER): Payer: PRIVATE HEALTH INSURANCE

## 2012-01-17 DIAGNOSIS — E291 Testicular hypofunction: Secondary | ICD-10-CM

## 2012-01-17 MED ORDER — TESTOSTERONE CYPIONATE 200 MG/ML IM SOLN
150.0000 mg | Freq: Once | INTRAMUSCULAR | Status: AC
  Administered 2012-01-17: 150 mg via INTRAMUSCULAR

## 2012-01-31 ENCOUNTER — Ambulatory Visit (INDEPENDENT_AMBULATORY_CARE_PROVIDER_SITE_OTHER): Payer: PRIVATE HEALTH INSURANCE

## 2012-01-31 DIAGNOSIS — E291 Testicular hypofunction: Secondary | ICD-10-CM

## 2012-01-31 MED ORDER — TESTOSTERONE CYPIONATE 200 MG/ML IM SOLN
150.0000 mg | Freq: Once | INTRAMUSCULAR | Status: AC
  Administered 2012-01-31: 150 mg via INTRAMUSCULAR

## 2012-02-14 ENCOUNTER — Ambulatory Visit (INDEPENDENT_AMBULATORY_CARE_PROVIDER_SITE_OTHER): Payer: PRIVATE HEALTH INSURANCE | Admitting: Internal Medicine

## 2012-02-14 DIAGNOSIS — E291 Testicular hypofunction: Secondary | ICD-10-CM

## 2012-02-14 MED ORDER — TESTOSTERONE CYPIONATE 200 MG/ML IM SOLN
150.0000 mg | Freq: Once | INTRAMUSCULAR | Status: AC
  Administered 2012-02-14: 150 mg via INTRAMUSCULAR

## 2012-02-17 NOTE — Progress Notes (Signed)
Testosterone inj

## 2012-02-28 ENCOUNTER — Ambulatory Visit (INDEPENDENT_AMBULATORY_CARE_PROVIDER_SITE_OTHER): Payer: PRIVATE HEALTH INSURANCE

## 2012-02-28 DIAGNOSIS — E291 Testicular hypofunction: Secondary | ICD-10-CM

## 2012-02-28 MED ORDER — TESTOSTERONE CYPIONATE 200 MG/ML IM SOLN
150.0000 mg | Freq: Once | INTRAMUSCULAR | Status: AC
  Administered 2012-02-28: 150 mg via INTRAMUSCULAR

## 2012-03-16 ENCOUNTER — Ambulatory Visit (INDEPENDENT_AMBULATORY_CARE_PROVIDER_SITE_OTHER): Payer: PRIVATE HEALTH INSURANCE | Admitting: *Deleted

## 2012-03-16 DIAGNOSIS — E291 Testicular hypofunction: Secondary | ICD-10-CM

## 2012-03-16 MED ORDER — TESTOSTERONE CYPIONATE 100 MG/ML IM SOLN
75.0000 mg | Freq: Once | INTRAMUSCULAR | Status: AC
  Administered 2012-03-16: 75 mg via INTRAMUSCULAR

## 2012-03-27 ENCOUNTER — Ambulatory Visit: Payer: PRIVATE HEALTH INSURANCE

## 2012-03-30 ENCOUNTER — Ambulatory Visit (INDEPENDENT_AMBULATORY_CARE_PROVIDER_SITE_OTHER): Payer: PRIVATE HEALTH INSURANCE | Admitting: *Deleted

## 2012-03-30 DIAGNOSIS — E291 Testicular hypofunction: Secondary | ICD-10-CM

## 2012-03-30 MED ORDER — TESTOSTERONE CYPIONATE 200 MG/ML IM SOLN
100.0000 mg | Freq: Once | INTRAMUSCULAR | Status: AC
  Administered 2012-03-30: 100 mg via INTRAMUSCULAR

## 2012-04-10 ENCOUNTER — Ambulatory Visit (INDEPENDENT_AMBULATORY_CARE_PROVIDER_SITE_OTHER): Payer: BC Managed Care – PPO | Admitting: *Deleted

## 2012-04-10 DIAGNOSIS — E291 Testicular hypofunction: Secondary | ICD-10-CM

## 2012-04-10 MED ORDER — TESTOSTERONE CYPIONATE 100 MG/ML IM SOLN: 75.0000 mg | Freq: Once | INTRAMUSCULAR | Status: AC

## 2012-04-24 ENCOUNTER — Ambulatory Visit (INDEPENDENT_AMBULATORY_CARE_PROVIDER_SITE_OTHER): Payer: BC Managed Care – PPO | Admitting: *Deleted

## 2012-04-24 DIAGNOSIS — E291 Testicular hypofunction: Secondary | ICD-10-CM

## 2012-04-24 MED ORDER — TESTOSTERONE CYPIONATE 100 MG/ML IM SOLN: 75.0000 mg | Freq: Once | INTRAMUSCULAR | Status: AC

## 2012-05-08 ENCOUNTER — Ambulatory Visit: Payer: BC Managed Care – PPO

## 2012-05-22 ENCOUNTER — Ambulatory Visit: Payer: BC Managed Care – PPO

## 2012-05-25 ENCOUNTER — Ambulatory Visit: Payer: BC Managed Care – PPO | Admitting: Internal Medicine

## 2012-05-25 ENCOUNTER — Ambulatory Visit (INDEPENDENT_AMBULATORY_CARE_PROVIDER_SITE_OTHER): Payer: BC Managed Care – PPO | Admitting: *Deleted

## 2012-05-25 DIAGNOSIS — E291 Testicular hypofunction: Secondary | ICD-10-CM

## 2012-05-25 MED ORDER — TESTOSTERONE CYPIONATE 200 MG/ML IM SOLN
75.0000 mg | Freq: Once | INTRAMUSCULAR | Status: AC
  Administered 2012-05-25: 76 mg via INTRAMUSCULAR

## 2012-06-08 ENCOUNTER — Ambulatory Visit (INDEPENDENT_AMBULATORY_CARE_PROVIDER_SITE_OTHER): Payer: BC Managed Care – PPO | Admitting: Internal Medicine

## 2012-06-08 DIAGNOSIS — E291 Testicular hypofunction: Secondary | ICD-10-CM

## 2012-06-08 MED ORDER — TESTOSTERONE CYPIONATE 200 MG/ML IM SOLN
150.0000 mg | Freq: Once | INTRAMUSCULAR | Status: AC
  Administered 2012-06-08: 150 mg via INTRAMUSCULAR

## 2012-06-08 NOTE — Progress Notes (Signed)
  Subjective:    Patient ID: Brian Holden, male    DOB: Sep 22, 1965, 47 y.o.   MRN: [id number]  HPI Patient presents for nurse visit for testosterone injection.   Review of Systems     Objective:   Physical Exam        Assessment & Plan:

## 2012-06-19 ENCOUNTER — Ambulatory Visit (INDEPENDENT_AMBULATORY_CARE_PROVIDER_SITE_OTHER): Payer: BC Managed Care – PPO

## 2012-06-19 DIAGNOSIS — E291 Testicular hypofunction: Secondary | ICD-10-CM

## 2012-06-19 MED ORDER — TESTOSTERONE CYPIONATE 200 MG/ML IM SOLN
200.0000 mg | Freq: Once | INTRAMUSCULAR | Status: AC
  Administered 2012-06-19: 75 mg via INTRAMUSCULAR

## 2012-07-03 ENCOUNTER — Ambulatory Visit (INDEPENDENT_AMBULATORY_CARE_PROVIDER_SITE_OTHER): Payer: BC Managed Care – PPO

## 2012-07-03 DIAGNOSIS — E291 Testicular hypofunction: Secondary | ICD-10-CM

## 2012-07-16 ENCOUNTER — Ambulatory Visit (INDEPENDENT_AMBULATORY_CARE_PROVIDER_SITE_OTHER): Payer: BC Managed Care – PPO

## 2012-07-16 DIAGNOSIS — E291 Testicular hypofunction: Secondary | ICD-10-CM

## 2012-07-16 MED ORDER — TESTOSTERONE CYPIONATE 100 MG/ML IM SOLN
100.0000 mg | Freq: Once | INTRAMUSCULAR | Status: AC
  Administered 2012-07-16: 0.75 mg via INTRAMUSCULAR

## 2012-07-29 ENCOUNTER — Ambulatory Visit (INDEPENDENT_AMBULATORY_CARE_PROVIDER_SITE_OTHER): Payer: BC Managed Care – PPO | Admitting: *Deleted

## 2012-07-29 DIAGNOSIS — E291 Testicular hypofunction: Secondary | ICD-10-CM

## 2012-07-29 MED ORDER — TESTOSTERONE CYPIONATE 200 MG/ML IM SOLN
75.0000 mg | Freq: Once | INTRAMUSCULAR | Status: AC
  Administered 2012-07-29: 76 mg via INTRAMUSCULAR

## 2014-09-16 ENCOUNTER — Telehealth: Payer: Self-pay | Admitting: Internal Medicine

## 2014-09-16 NOTE — Telephone Encounter (Signed)
Left patient VM to call back to transfer from [place].

## 2017-10-31 LAB — PSA PROSTATIC SPECIFIC ANTIGEN: PSA: 1.7 (ng/mL) (ref 0.0–4.0)

## 2017-10-31 LAB — TSH 3RD GENERATION: TSH: 1.94 (uIU/mL) (ref 0.450–4.500)

## 2017-10-31 LAB — TESTOSTERONE, FREE, TOTAL
Testosterone, Free Direct: 6.4 (pg/mL) — ABNORMAL LOW (ref 7.2–24.0)
Testosterone: 650 (ng/dL) (ref 264–916)

## 2017-10-31 LAB — ESTRADIOL: Estradiol: 35.7 (pg/mL) (ref 7.6–42.6)

## 2017-10-31 LAB — FOLLICLE STIMULATING HORMONE: FSH: <0.2 (mIU/mL) — ABNORMAL LOW (ref 1.5–12.4)

## 2017-10-31 LAB — LUTEINIZING HORMONE: LH: <0.2 (mIU/mL) — ABNORMAL LOW (ref 1.7–8.6)

## 2017-12-03 LAB — CBC/DIFF AMBIGUOUS DEFAULT
Basophils %: 1 (%)
Basophils Absolute: 0 10*3/uL (ref 0.0–0.2)
Eosinophils %: 3 (%)
Eosinophils Absolute: 0.1 10*3/uL (ref 0.0–0.4)
Hematocrit: 46.4 (%) (ref 37.5–51.0)
Hemoglobin: 15.6 g/dL (ref 13.0–17.7)
Immature Grans (Abs): 0 10*3/uL (ref 0.0–0.1)
Immature Granulocytes: 0 (%)
Lymphocytes %: 23 (%)
Lymphocytes Absolute: 0.7 10*3/uL (ref 0.7–3.1)
MCH: 32.4 (pg) (ref 26.6–33.0)
MCHC: 33.6 g/dL (ref 31.5–35.7)
MCV: 97 (fL) (ref 79–97)
Monocytes %: 9 (%)
Monocytes Absolute: 0.3 10*3/uL (ref 0.1–0.9)
Neutrophils %: 64 (%)
Neutrophils Absolute: 1.9 10*3/uL (ref 1.4–7.0)
Platelets: 151 10*3/uL (ref 150–450)
RBC: 4.81 10*6/uL (ref 4.14–5.80)
RDW: 13 (%) (ref 12.3–15.4)
WBC: 2.9 10*3/uL — ABNORMAL LOW (ref 3.4–10.8)

## 2017-12-03 LAB — TESTOSTERONE, FREE, TOTAL
Testosterone, Free Direct: 19.2 (pg/mL) (ref 7.2–24.0)
Testosterone: 1312 (ng/dL) — ABNORMAL HIGH (ref 264–916)

## 2017-12-03 LAB — SEX HORMONE BINDING GLOBULIN: Sex Hormone Binding: 69.7 (nmol/L) (ref 19.3–76.4)

## 2018-04-29 LAB — PROSTATE SPECIFIC ANTIGEN, TOTAL: PSA: 1.46 ng/mL (ref 0.000–4.000)

## 2018-04-29 LAB — LUTEINIZING HORMONE: LH: <0.1 m[IU]/mL — ABNORMAL LOW (ref 1.14–8.75)

## 2018-04-29 LAB — FOLLICLE STIMULATING HORMONE: FSH: <0.1 IU/mL — ABNORMAL LOW (ref 1.50–12.40)

## 2018-05-01 LAB — TESTOSTERONE, FREE/TOT EQUILIB
Testosterone % Free: 2.6 % (ref 1.50–4.20)
Testosterone, Free: 15.55 ng/dL (ref 5.00–21.00)
Testosterone: 598 ng/dL (ref 264–916)

## 2019-07-21 LAB — COMPREHENSIVE METABOLIC PANEL
ALT: 25 IU/L (ref 0–44)
AST: 35 IU/L (ref 0–40)
Albumin/Globulin Ratio: 2.2 (ref 1.2–2.2)
Albumin: 4.6 g/dL (ref 3.8–4.9)
Alkaline Phosphatase: 62 IU/L (ref 39–117)
BUN/Creatinine Ratio: 17 (ref 9–20)
BUN: 27 (mg/dL) — ABNORMAL HIGH (ref 6–24)
CO2: 28 (mmol/L) (ref 20–29)
Calcium: 9.5 (mg/dL) (ref 8.7–10.2)
Chloride: 100 (mmol/L) (ref 96–106)
Creatinine: 1.56 (mg/dL) — ABNORMAL HIGH (ref 0.76–1.27)
GFR African American: 58 (mL/min/1.73) — ABNORMAL LOW (ref >59)
GFR Non-African American: 50 (mL/min/1.73) — ABNORMAL LOW (ref >59)
Globulin, Total: 2.1 g/dL (ref 1.5–4.5)
Glucose: 77 (mg/dL) (ref 65–99)
Potassium: 4.6 (mmol/L) (ref 3.5–5.2)
Sodium: 143 (mmol/L) (ref 134–144)
Total Bilirubin: 0.6 (mg/dL) (ref 0.0–1.2)
Total Protein: 6.7 g/dL (ref 6.0–8.5)

## 2019-07-21 LAB — CBC/DIFF AMBIGUOUS DEFAULT
Basophils %: 0 (%)
Basophils Absolute: 0 10*3/uL (ref 0.0–0.2)
Eosinophils %: 2 (%)
Eosinophils Absolute: 0.1 10*3/uL (ref 0.0–0.4)
Hematocrit: 51.5 (%) — ABNORMAL HIGH (ref 37.5–51.0)
Hemoglobin: 18 g/dL — ABNORMAL HIGH (ref 13.0–17.7)
Immature Grans (Abs): 0 10*3/uL (ref 0.0–0.1)
Immature Granulocytes: 0 (%)
Lymphocytes %: 19 (%)
Lymphocytes Absolute: 0.9 10*3/uL (ref 0.7–3.1)
MCH: 33.6 (pg) — ABNORMAL HIGH (ref 26.6–33.0)
MCHC: 35 g/dL (ref 31.5–35.7)
MCV: 96 (fL) (ref 79–97)
Monocytes %: 7 (%)
Monocytes Absolute: 0.3 10*3/uL (ref 0.1–0.9)
Neutrophils %: 72 (%)
Neutrophils Absolute: 3.2 10*3/uL (ref 1.4–7.0)
Platelets: 138 10*3/uL — ABNORMAL LOW (ref 150–450)
RBC: 5.35 10*6/uL (ref 4.14–5.80)
RDW: 12.6 (%) (ref 11.6–15.4)
WBC: 4.5 10*3/uL (ref 3.4–10.8)

## 2019-07-21 LAB — LIPID PANEL
Cholesterol: 165 (mg/dL) (ref 100–199)
HDL: 50 (mg/dL) (ref >39)
LDL Calculated: 96 (mg/dL) (ref 0–99)
Triglycerides: 107 (mg/dL) (ref 0–149)
VLDL Cholesterol Calculated: 19 (mg/dL) (ref 5–40)

## 2019-07-21 LAB — PSA PROSTATIC SPECIFIC ANTIGEN: PSA: 1.7 (ng/mL) (ref 0.0–4.0)

## 2019-07-21 LAB — SEX HORMONE BINDING GLOBULIN: Sex Hormone Binding: 45.7 (nmol/L) (ref 19.3–76.4)

## 2019-07-21 LAB — TESTOSTERONE, FREE, TOTAL
Testosterone, Free Direct: 21.3 (pg/mL) (ref 7.2–24.0)
Testosterone: >1500 (ng/dL) — ABNORMAL HIGH (ref 264–916)

## 2019-07-21 LAB — AMBIG ABBREV LP DEFAULT

## 2019-09-16 LAB — SEX HORMONE BINDING GLOBULIN: Sex Hormone Binding: 47.59 nmol/L (ref 19.30–76.40)

## 2019-09-16 LAB — LUTEINIZING HORMONE: LH: <0.1 m[IU]/mL — ABNORMAL LOW (ref 1.14–8.75)

## 2019-09-16 LAB — PROSTATE SPECIFIC ANTIGEN, TOTAL: PSA: 1.74 ng/mL (ref 0.000–4.000)

## 2019-09-16 LAB — FOLLICLE STIMULATING HORMONE: FSH: <0.1 IU/mL — ABNORMAL LOW (ref 1.50–12.40)

## 2019-09-16 LAB — TESTOSTERONE: Testosterone: 669.2 ng/dL (ref 193.0–740.0)

## 2019-09-19 LAB — TESTOSTERONE, FREE/TOT EQUILIB
Testosterone % Free: 3.42 % (ref 1.50–4.20)
Testosterone, Free: 23.26 ng/dL — ABNORMAL HIGH (ref 5.00–21.00)
Testosterone: 680 ng/dL (ref 264–916)

## 2020-05-24 LAB — CBC
Hematocrit: 49.7 % (ref 38.0–52.0)
Hemoglobin: 17.1 g/dL (ref 13.0–17.3)
MCH: 32.8 pg (ref 27.0–34.5)
MCHC: 34.4 g/dL (ref 32.0–36.0)
MCV: 95.2 fL (ref 84.0–100.0)
MPV: 9.4 fL (ref 7.2–13.2)
NRBC Absolute: 0 10*3/uL (ref 0.000–0.012)
NRBC Automated: 0 % (ref 0.0–0.2)
Platelets: 157 10*3/uL (ref 140–440)
RBC: 5.22 x10e6/mcL (ref 4.00–5.60)
RDW: 12.7 % (ref 11.0–16.0)
WBC: 4.2 10*3/uL (ref 3.8–10.6)

## 2020-05-24 LAB — TSH: TSH, 3RD GENERATION: 1.58 mcIU/mL (ref 0.358–3.740)

## 2020-05-25 LAB — TESTOSTERONE: Testosterone: 967 ng/dL — ABNORMAL HIGH (ref 193.0–740.0)

## 2020-09-22 LAB — CBC
Hematocrit: 51.5 % (ref 38.0–52.0)
Hemoglobin: 17.3 g/dL (ref 13.0–17.3)
MCH: 31.6 pg (ref 27.0–34.5)
MCHC: 33.6 g/dL (ref 32.0–36.0)
MCV: 94 fL (ref 84.0–100.0)
MPV: 10.1 fL (ref 7.2–13.2)
NRBC Absolute: 0 10*3/uL (ref 0.000–0.012)
NRBC Automated: 0 % (ref 0.0–0.2)
Platelets: 136 10*3/uL — ABNORMAL LOW (ref 140–440)
RBC: 5.48 x10e6/mcL (ref 4.00–5.60)
RDW: 12.4 % (ref 11.0–16.0)
WBC: 2.8 10*3/uL — ABNORMAL LOW (ref 3.8–10.6)

## 2020-09-22 LAB — PROSTATE SPECIFIC ANTIGEN, TOTAL: PSA: 2.02 ng/mL (ref 0.000–4.000)

## 2020-09-22 LAB — TESTOSTERONE: Testosterone: 743 ng/dL — ABNORMAL HIGH (ref 193.0–740.0)

## 2020-09-22 LAB — TSH: TSH, 3RD GENERATION: 2.4 mcIU/mL (ref 0.358–3.740)

## 2020-09-30 LAB — TESTOSTERONE: Testosterone: 563 (ng/dL) (ref 264–916)

## 2021-02-12 ENCOUNTER — Encounter

## 2021-02-19 ENCOUNTER — Telehealth

## 2021-02-19 MED ORDER — TESTOSTERONE 25 MG/2.5GM (1%) TD GEL
25 MG/2.5GM (1%) | TRANSDERMAL | 1 refills | Status: DC
Start: 2021-02-19 — End: 2021-02-20

## 2021-02-19 NOTE — Telephone Encounter (Signed)
hi

## 2021-02-19 NOTE — Telephone Encounter (Signed)
Patient is requesting refill   Medication:Testosterone 25 mg/2.5gm (1%) gel, apply 5 packets to the upper arms and or abdomen transdermal Once a day, 30 day supply with refills   Have there been any medication changes since your last visit?No   Ph[facility]: [facility] located on [address] [place], [place] [address]

## 2021-02-20 MED ORDER — TESTOSTERONE 25 MG/2.5GM (1%) TD GEL
25 MG/2.5GM (1%) | TRANSDERMAL | 1 refills | Status: DC
Start: 2021-02-20 — End: 2021-03-09

## 2021-02-20 NOTE — Telephone Encounter (Signed)
Done

## 2021-02-20 NOTE — Addendum Note (Signed)
Addended by: Celso Amy on: 02/20/2021 01:40 PM     Modules accepted: Orders

## 2021-03-07 ENCOUNTER — Telehealth

## 2021-03-07 ENCOUNTER — Encounter: Primary: Hematology & Oncology

## 2021-03-07 MED ORDER — TESTOSTERONE 50 MG/5GM (1%) TD GEL
5051 MG/5GM (1%) | TRANSDERMAL | 1 refills | Status: AC
Start: 2021-03-07 — End: 2021-09-05

## 2021-03-07 NOTE — Telephone Encounter (Signed)
Patient is calling again,states the script needs to be changed to 50 MG with  5 Gram gel and a prior authorization is needed on the new script and a new script would also need to be sent to the pharmacy. Patient stated he has been out for 2 days and the pharmacy does had the one listed and would like to resolve this ASAP.Marland Kitchen Please Advise.

## 2021-03-07 NOTE — Telephone Encounter (Signed)
Patient is having issues with testosterone (ANDROGEL) 25 MG/2.5GM (1%) GEL 1 % gel. There is a manufacturer issue   The pharmacy says they have a different dosage ( 50MG  and a 5 gram/ packet), the patient has been out for 2 days, he would like a resolution. He was told he would need a new script and PA. He would like to get this done as soon as possible

## 2021-03-08 NOTE — Telephone Encounter (Signed)
Save/ pharmists are running out of Testosterone.

## 2021-03-08 NOTE — Telephone Encounter (Signed)
Patient is requesting to speak to Kevin Richards about his Androgel prescription. The pharmacy is now out of stock of his prescription. Patient needs to know what to do. Please advise

## 2021-03-08 NOTE — Telephone Encounter (Signed)
I have been unable to contact pharmacy. Yesterday afternoon after 4pm I call three times and was hung up on at [facility] [address] [facility] Today I had to leave a message twice, Pt States "pharmacy staff stated -the approve prior auth was not receive yet." Today I triied to fax the prior auth information I received from [facility] and the fax has been disconnected.

## 2021-03-08 NOTE — Telephone Encounter (Signed)
Received a call from Mr Kevin Richards who stated today the pharmacy did not receive the Prior auth. for his medication. Which [employer] stated they would fax it right away.(03/07/2021) Which [employer] did sent it out because we received it also.(03/07/2021 4:30p) Mr Kevin Richards asked what do you do when we are the middle men and the pharmacy doesn't respond to requests.

## 2021-03-08 NOTE — Telephone Encounter (Signed)
Transferred to miss carol to relay message

## 2021-03-09 ENCOUNTER — Encounter

## 2021-03-09 MED ORDER — TESTOSTERONE 25 MG/2.5GM (1%) TD GEL
252.51 MG/2.5GM (1%) | TRANSDERMAL | 1 refills | Status: DC
Start: 2021-03-09 — End: 2021-08-29

## 2021-03-09 MED ORDER — TESTOSTERONE 25 MG/2.5GM (1%) TD GEL
252.51 MG/2.5GM (1%) | TRANSDERMAL | 1 refills | Status: DC
Start: 2021-03-09 — End: 2021-11-15

## 2021-03-09 NOTE — Progress Notes (Signed)
I spoke with patient. He gave me the number to [facility] pharmacy. I called and they have the medication in stock. I spoke to pharmacist Tresa Endo. Please call [facility] at [phone], ask to speak to pharmacist and give her the prior authorization number (the one for the 25 mg packets), so they don't give him a hard time about prior authorization

## 2021-03-09 NOTE — Telephone Encounter (Signed)
Called Mr Deroy , There is a medication called Kyzatrex. Comes in 100mg .150mg ,200mg  FDA approved Aug 2022.

## 2021-03-09 NOTE — Telephone Encounter (Signed)
Mr Borak called said [facility] ran out of the testosterone that was there.  I looked up Testosterone and found 17. There is one( $54.00 cost at [facility], $52.00 at [facility]) and other drug stores called Kyzatrex. It became approved 10/2020, in pill form 100mg , 150mg  and 200mg . One a day. Is this something Dr would consider? Called pt left message to ask if this was a consideration too.

## 2021-03-12 NOTE — Telephone Encounter (Signed)
Sent script to [facility].

## 2021-03-12 NOTE — Telephone Encounter (Signed)
Androgel sent to [facility] as directed by pt.

## 2021-03-16 NOTE — Telephone Encounter (Signed)
Got it

## 2021-04-04 ENCOUNTER — Ambulatory Visit: Admit: 2021-04-04 | Discharge: 2021-04-04 | Payer: BLUE CROSS/BLUE SHIELD | Primary: Hematology & Oncology

## 2021-04-04 ENCOUNTER — Ambulatory Visit: Payer: BLUE CROSS/BLUE SHIELD | Primary: Hematology & Oncology

## 2021-04-04 DIAGNOSIS — D751 Secondary polycythemia: Secondary | ICD-10-CM

## 2021-04-25 ENCOUNTER — Encounter: Attending: "Endocrinology | Primary: Hematology & Oncology

## 2021-05-21 ENCOUNTER — Ambulatory Visit
Admit: 2021-05-21 | Discharge: 2021-05-21 | Payer: BLUE CROSS/BLUE SHIELD | Attending: "Endocrinology | Primary: Hematology & Oncology

## 2021-05-21 ENCOUNTER — Encounter

## 2021-05-21 DIAGNOSIS — E291 Testicular hypofunction: Secondary | ICD-10-CM

## 2021-05-21 LAB — CBC WITH AUTO DIFFERENTIAL
Absolute Baso #: 0 10*3/uL (ref 0.0–0.2)
Absolute Eos #: 0.1 10*3/uL (ref 0.0–0.5)
Absolute Lymph #: 1 10*3/uL (ref 1.0–3.2)
Absolute Mono #: 0.3 10*3/uL (ref 0.3–1.0)
Basophils %: 0.4 % (ref 0.0–2.0)
Eosinophils %: 2.1 % (ref 0.0–7.0)
Hematocrit: 51.5 % (ref 38.0–52.0)
Hemoglobin: 17.3 g/dL (ref 13.0–17.3)
Immature Grans (Abs): 0.01 10*3/uL (ref 0.00–0.06)
Immature Granulocytes: 0.2 % (ref 0.0–0.6)
Lymphocytes: 22.1 % (ref 15.0–45.0)
MCH: 30.7 pg (ref 27.0–34.5)
MCHC: 33.6 g/dL (ref 32.0–36.0)
MCV: 91.3 fL (ref 84.0–100.0)
MPV: 9.5 fL (ref 7.2–13.2)
Monocytes: 6.2 % (ref 4.0–12.0)
NRBC Absolute: 0 10*3/uL (ref 0.000–0.012)
NRBC Automated: 0 % (ref 0.0–0.2)
Neutrophils %: 69 % (ref 42.0–74.0)
Neutrophils Absolute: 3.2 10*3/uL (ref 1.6–7.3)
Platelets: 162 10*3/uL (ref 140–440)
RBC: 5.64 x10e6/mcL — ABNORMAL HIGH (ref 4.00–5.60)
RDW: 14 % (ref 11.0–16.0)
WBC: 4.7 10*3/uL (ref 3.8–10.6)

## 2021-05-21 LAB — TESTOSTERONE: Testosterone: 678 ng/dL (ref 193.0–740.0)

## 2021-05-21 LAB — PSA SCREENING: Screening PSA: 3.66 ng/mL (ref 0.000–4.000)

## 2021-05-21 NOTE — Progress Notes (Signed)
Chief Complaint   Patient presents with    Follow-up       HPI:    Kevin Richards (DOB:  July 10, 1965) is a 56 y.o. 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 At one point, the patient was hospitalized and was given ATG. The patient has not  experienced many symptoms of PNH through the years, but has required treatment with testosterone for hypogonadism. He  moved to [place] around 2015. I increased the dose of his testosterone to 5 packets per day and since then he has felt much  better. He was able to exercise and lose significant amount of weight. Patient had difficulty getting his T medication filled the last time.    Past Medical History:   Diagnosis Date    Hypogonadism in male         History reviewed. No pertinent surgical history.     Outpatient Medications Prior to Visit   Medication Sig Dispense Refill    testosterone (ANDROGEL) 25 MG/2.5GM (1%) GEL 1 % gel Apply 5 packets daily on shoulders and upper abdomen. Dispense 90 day supply. Prior Authorization approved from insurance. 450 each 1    folic acid (FOLVITE) 1 MG tablet 1 tablet Orally Once a day for 30 day(s)      testosterone (ANDROGEL) 25 MG/2.5GM (1%) GEL 1 % gel apply 5 packets to the upper arms and or abdomen Transdermal Once a day for 90 days 450 each 1    testosterone (ANDROGEL) 50 MG/5GM (1%) GEL 1% gel apply 2 packets, equivalent to 100 mg per day to the upper arms and or abdomen Transdermal Once a day, [id number] (Patient not taking: Reported on 05/21/2021) 180 each 1     No facility-administered medications prior to visit.        Allergies   Allergen Reactions    Gadolinium Nausea Only    Iodides      Other reaction(s): severe fever    Iodinated Contrast Media Nausea Only         reports that he has  never smoked. He has never used smokeless tobacco. He reports that he does not drink alcohol and does not use drugs.     History reviewed. No pertinent family history.        ROS:  Other than what was mentioned in HPI, a thorough 11 point Review of systems was done and was negative.    Vitals:    05/21/21 1113   BP: 130/88   Pulse: 82   Weight: 216 lb (98 kg)   Height: 6' (1.829 m)        Body mass index is 29.29 kg/m??.     PE:    GENERAL APPEARANCE: well developed, well nourished, in no acute distress.   HEAD: normocephalic, atraumatic.   EYES: pupils equal, round, reactive to light and accommodation, sclera non-icteric.   EARS: normal.   ORAL CAVITY: mucosa moist.   THROAT: clear.   NECK: neck supple, full range of motion, no cervical lymphadenopathy, no thyromegaly, thyroid nontender, trachea midline.   HEART: regular rate and rhythm, S1, S2 normal, no murmurs.   LUNGS: clear to auscultation bilaterally.   ABDOMEN: soft, nontender, nondistended, bowel sounds present, normal.   SKIN: warm and dry, no suspicious lesions.  EXTREMITIES: no clubbing, cyanosis, or edema.   NEUROLOGIC: nonfocal, motor strength normal upper and lower extremities, sensory exam intact.     [facility] Historical Encounter on 09/29/2020   Component Date Value Ref Range Status    Testosterone 09/29/2020 563  264 - 916 (ng/dL) Final    Comment: Adult male reference interval is based on a population of  healthy nonobese males (BMI <30) between 50 and 48 years old.  Travison, et.al. JCEM [phone]. PMID: [id number].  PERFORMING LAB: [facility], [address] [place], [place] [id number], Phone - [phone], Director - MDNagendra  Unless Otherwise Notified, Test Performed At : [facility], , [place] , [id number] [phone]  [address] [place], [place], [address] [phone]          ASSESSMENT/PLAN:  1. Hypogonadism in male  -     PSA Screening; Future  -     CBC with Auto Differential; Future  -      Testosterone; Future  -     CBC with Auto Differential; Future  -     Testosterone; Future  2. Testicular hypofunction  3. Alopecia    Patient has been on testosterone therapy for more than 30 years.  He needs the testosterone treatment and the current method of administration is the best and most physiological.  Other causes of fatigue would need to be evaluated and discussed as well.  We will check labs today and the patient needs to call us back to discuss the results and whether any further modification in therapy would be necessary.   I also encouraged the patient to get a primary care physician for health maintenance and yearly annual physical.    Return in about 6 months (around 11/18/2021).           An electronic signature was used to authenticate this note.    --Celso Amy, MD

## 2021-05-22 ENCOUNTER — Encounter

## 2021-05-22 MED ORDER — FOLIC ACID 1 MG PO TABS
1 MG | ORAL_TABLET | ORAL | 3 refills | Status: AC
Start: 2021-05-22 — End: 2022-08-15

## 2021-05-22 NOTE — Telephone Encounter (Signed)
Requested Prescriptions     Pending Prescriptions Disp Refills    folic acid (FOLVITE) 1 MG tablet 90 tablet 3     Sig: 1 tablet Orally Once a day for 30 day(s)

## 2021-05-28 ENCOUNTER — Encounter

## 2021-05-30 ENCOUNTER — Ambulatory Visit
Admit: 2021-05-30 | Discharge: 2021-05-30 | Payer: BLUE CROSS/BLUE SHIELD | Attending: Hematology & Oncology | Primary: Hematology & Oncology

## 2021-05-30 ENCOUNTER — Ambulatory Visit: Admit: 2021-05-30 | Discharge: 2021-05-30 | Payer: BLUE CROSS/BLUE SHIELD | Primary: Hematology & Oncology

## 2021-05-30 ENCOUNTER — Encounter

## 2021-05-30 DIAGNOSIS — D595 Paroxysmal nocturnal hemoglobinuria [Marchiafava-Micheli]: Secondary | ICD-10-CM

## 2021-05-30 LAB — COMPREHENSIVE METABOLIC PANEL
ALT: 24 U/L (ref 0–50)
AST: 23 U/L (ref 0–50)
Albumin: 4.4 g/dl (ref 3.5–5.2)
Alk Phosphatase: 53 U/L (ref 40–130)
BUN: 31 mg/dl — ABNORMAL HIGH (ref 6–20)
CO2: 34 mmol/L — ABNORMAL HIGH (ref 22–29)
Calcium: 9.2 mg/dl (ref 8.4–10.2)
Chloride: 101 mmol/L (ref 98–107)
Creatinine: 1.5 mg/dl — ABNORMAL HIGH (ref 0.7–1.3)
Glucose: 95 mg/dl (ref 70–99)
Potassium: 4.6 mmol/L (ref 3.5–5.3)
Sodium: 140 mmol/L (ref 135–145)
Total Bilirubin: 0.4 mg/dl (ref 0.0–1.2)
Total Protein: 7.1 g/dl (ref 6.4–8.3)

## 2021-05-30 LAB — CBC WITH AUTO DIFFERENTIAL
Absolute Baso #: 0 10*3/uL (ref 0.0–0.2)
Absolute Eos #: 0.1 10*3/uL (ref 0.0–0.7)
Absolute Lymph #: 1 10*3/uL (ref 0.6–3.4)
Basophils %: 0.9 % (ref 0.0–2.5)
Eosinophils %: 2.9 % (ref 0.0–7.0)
Hematocrit: 55.9 % — ABNORMAL HIGH (ref 37.2–47.8)
Hemoglobin: 16.7 g/dL — ABNORMAL HIGH (ref 12.5–16.1)
Lymphocytes: 22 % (ref 10.0–50.0)
MCH: 29 pg (ref 27–33)
MCHC: 29.9 g/dL — ABNORMAL LOW (ref 32.5–34.8)
MCV: 95 fL (ref 81–97)
MPV: 7.16 fL (ref 7.00–10.00)
Monocytes Absolute: 0.3 10*3/uL (ref 0.0–0.9)
Monocytes: 7 % (ref 0.0–12.0)
Neutrophils %: 67.3 % (ref 37.0–80.0)
Neutrophils Absolute: 3.1 10*3/uL (ref 2.0–6.9)
Platelets: 185 10*3/uL (ref 142–424)
RBC: 5.85 M/uL — ABNORMAL HIGH (ref 4.00–5.58)
RDW: 13.7 % (ref 11.6–14.8)
WBC: 4.6 10*3/uL (ref 3.2–12.0)

## 2021-05-30 NOTE — Progress Notes (Signed)
Patient: Kevin Richards  DOB: December 12, 1965 (56 y.o.)  Date: 05/30/2021          CHIEF COMPLAINT:  Chief Complaint   Patient presents with    Follow-up          HISTORY OF PRESENT ILLNESS:   Patient reports he is feeling well.  He has good energy level and good appetite.  He has had no fevers chills night sweats.  He denies any chest or abdominal discomfort.  He has had no neurologic symptoms.  He denies any change in urine color.  He has no difficulty urinating.         PAST MEDICAL HISTORY:  Past Medical History:   Diagnosis Date    Hypogonadism in male        PAST SURGICAL HISTORY:  No past surgical history on file.    HOME MEDICATIONS:  Current Outpatient Medications   Medication Sig Dispense Refill    folic acid (FOLVITE) 1 MG tablet 1 tablet Orally Once a day for 30 day(s) 90 tablet 3    testosterone (ANDROGEL) 25 MG/2.5GM (1%) GEL 1 % gel Apply 5 packets daily on shoulders and upper abdomen. Dispense 90 day supply. Prior Authorization approved from insurance. 450 each 1    testosterone (ANDROGEL) 25 MG/2.5GM (1%) GEL 1 % gel apply 5 packets to the upper arms and or abdomen Transdermal Once a day for 90 days 450 each 1    testosterone (ANDROGEL) 50 MG/5GM (1%) GEL 1% gel apply 2 packets, equivalent to 100 mg per day to the upper arms and or abdomen Transdermal Once a day, [id number] (Patient not taking: Reported on 05/21/2021) 180 each 1     No current facility-administered medications for this visit.        ALLERGIES:  Gadolinium, Iodides, and Iodinated contrast media    SOCIAL HISTORY:  Social History     Socioeconomic History    Marital status: Married   Tobacco Use    Smoking status: Never    Smokeless tobacco: Never   Vaping Use    Vaping Use: Never used   Substance and Sexual Activity    Alcohol use: Never    Drug use: Never        REVIEW OF SYSTEMS:  Review of Systems   Constitutional:  Negative for activity change, appetite change, chills, fatigue, fever and unexpected weight change.   HENT:   Negative for sinus pain and sore throat.    Respiratory:  Negative for cough, chest tightness, shortness of breath and wheezing.    Cardiovascular:  Negative for chest pain and palpitations.   Gastrointestinal:  Negative for abdominal distention, abdominal pain, blood in stool, constipation, diarrhea, nausea and vomiting.   Genitourinary:  Negative for difficulty urinating, dysuria, hematuria and urgency.   Musculoskeletal:  Negative for arthralgias, joint swelling, myalgias and neck stiffness.   Skin:  Negative for rash.   Neurological:  Negative for dizziness, syncope, weakness, light-headedness, numbness and headaches.   Hematological:  Negative for adenopathy. Does not bruise/bleed easily.   Psychiatric/Behavioral:  Negative for confusion and hallucinations.           VITAL SIGNS:  BP (!) 146/96 (Site: Right Upper Arm, Position: Sitting, Cuff Size: Medium Adult)    Pulse 79    Temp 97.9 ??F (36.6 ??C)    Ht 6' (1.829 m)    Wt 217 lb (98.4 kg)    SpO2 96%    BMI 29.43 kg/m??     PHYSICAL  EXAM:    Constitutional:  Normal appearance; no acute distress  Eyes:  Conjunctiva normal; eyelids normal; sclera anicteric  Ear, Nose, Throat:  External ears and nose normal; hearing grossly normal; oropharynx unremarkable  Neck:  Supple; no masses; no adenopathy  Respiratory:  Breathing comfortably; lungs clear to auscultation and percussion  Cardiovascular:  Normal heart sounds; regular rate and rhythm; no peripheral edema  Abdomen:  No masses; no tenderness; no hepatomegaly; no splenomegaly; bowel sounds normal  Lymph nodes:  No cervical, axillary, supraclavicular or inguinal adenopathy  Skin:  No rash; no petechiae; no ecchymoses; no pallor; no cutaneous nodules  Musculoskeletal:  Normal muscle strength  Neurologic:  No focal motor or sensory deficit; normal gait; no abnormal mental status  Psychiatric:  Oriented to person time and place; mood and affect appropriate to situation; appropriate judgment and insight; memory  intact    LABORATORY DATA:     CBC:  Recent Labs     05/30/21  1504   WBC 4.6   RBC 5.85*   HGB 16.7*   HCT 55.9*   MCV 95   RDW 13.7   PLT 185     CHEMISTRIES:  Recent Labs     05/30/21  1504   NA 140   K 4.6   CL 101   CO2 34*   BUN 31*   CREATININE 1.5*   GLUCOSE 95     PT/INR:No results for input(s): PROTIME, INR in the last 72 hours.  APTT:No results for input(s): APTT in the last 72 hours.  LIVER PROFILE:  Recent Labs     05/30/21  1504   AST 23   ALT 24   BILITOT 0.4   ALKPHOS 53             RADIOLOGY RESULTS:     No results found.      Impression & Plan:      1. 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 Erythrocytosis          PLAN:  Patient continues on testosterone replacement and has a hematocrit of 55 and a hemoglobin of 16.7.  He will have a phlebotomy today.  I will try to keep his hematocrit under 53.0.  He will have CBC checked monthly in [facility] and I will see him in follow-up in 3 months.

## 2021-05-30 NOTE — Plan of Care (Signed)
THERAPEUTIC PHLEBOTOMY    Recent Lab Results    Lab Results   Component Value Date    WBC 4.6 05/30/2021    HGB 16.7 (H) 05/30/2021    HCT 55.9 (H) 05/30/2021    MCV 95 05/30/2021    PLT 185 05/30/2021     No results found for: IRON, TIBC, FERRITIN    Pre-phlebotomy Vital Signs     Vitals:    05/30/21 1617   BP: (!) 128/96   Pulse: 80         Tourniquet placed and arm assessed. Area of venous access cleansed with alcohol. PIV inserted into the LAC antecubital site. Blood flowing well into collection bag.     Needle removed from patient's arm.  Pressure applied to patient's arm until bleeding stopped. Dry dressing applied over puncture site and secured with Coban/self-adherent wrap.    Final amount of blood removed: 500  Post Vital Signs: Refer to Doc flow sheet  Post-Hydration (If applicable):    Patient tolerated procedure well.    Discharge Instructions provided to increase fluid intake, eat regularly and don't skip any meals, change positions slowly, no strenuous activity and keep dressing intact for 2 hours.

## 2021-05-31 ENCOUNTER — Encounter

## 2021-06-06 NOTE — Telephone Encounter (Signed)
Patient needs a prior auth for testosterone (ANDROGEL) 50 MG/5GM (1%) GEL 1% gel. Patient is completely out. Please call patient once authorization is approved.

## 2021-06-06 NOTE — Telephone Encounter (Signed)
There is androgel 25mg / 1%. Shouldn't be out. 5 pks

## 2021-06-06 NOTE — Telephone Encounter (Signed)
Checked chart gel was approved for a year starting 03/08/2021 to 03/08/2022. Calling pt.

## 2021-07-19 ENCOUNTER — Encounter: Primary: Hematology & Oncology

## 2021-07-19 ENCOUNTER — Ambulatory Visit: Payer: BLUE CROSS/BLUE SHIELD | Primary: Hematology & Oncology

## 2021-08-16 ENCOUNTER — Ambulatory Visit: Admit: 2021-08-16 | Discharge: 2021-08-16 | Payer: BLUE CROSS/BLUE SHIELD | Primary: Hematology & Oncology

## 2021-08-16 ENCOUNTER — Inpatient Hospital Stay: Admit: 2021-08-16 | Discharge: 2021-08-16 | Payer: BLUE CROSS/BLUE SHIELD | Primary: Hematology & Oncology

## 2021-08-16 ENCOUNTER — Ambulatory Visit: Payer: BLUE CROSS/BLUE SHIELD | Primary: Hematology & Oncology

## 2021-08-16 DIAGNOSIS — D595 Paroxysmal nocturnal hemoglobinuria [Marchiafava-Micheli]: Secondary | ICD-10-CM

## 2021-08-16 DIAGNOSIS — D751 Secondary polycythemia: Secondary | ICD-10-CM

## 2021-08-16 LAB — CBC WITH AUTO DIFFERENTIAL
Absolute Baso #: 0 10*3/uL (ref 0.0–0.2)
Absolute Eos #: 0.1 10*3/uL (ref 0.0–0.7)
Absolute Lymph #: 0.9 10*3/uL (ref 0.6–3.4)
Basophils %: 0.9 % (ref 0.0–2.5)
Eosinophils %: 3 % (ref 0.0–7.0)
Hematocrit: 54.5 % — ABNORMAL HIGH (ref 37.2–47.8)
Hemoglobin: 16.6 g/dL — ABNORMAL HIGH (ref 12.5–16.1)
Lymphocytes: 24.5 % (ref 10.0–50.0)
MCH: 29 pg (ref 27–33)
MCHC: 30.4 g/dL — ABNORMAL LOW (ref 32.5–34.8)
MCV: 95 fL (ref 81–97)
MPV: 6.66 fL — ABNORMAL LOW (ref 7.00–10.00)
Monocytes Absolute: 0.2 10*3/uL (ref 0.0–0.9)
Monocytes: 6 % (ref 0.0–12.0)
Neutrophils %: 65.7 % (ref 37.0–80.0)
Neutrophils Absolute: 2.5 10*3/uL (ref 2.0–6.9)
Platelets: 158 10*3/uL (ref 142–424)
RBC: 5.75 M/uL — ABNORMAL HIGH (ref 4.00–5.58)
RDW: 12.6 % (ref 11.6–14.8)
WBC: 3.8 10*3/uL (ref 3.2–12.0)

## 2021-08-16 LAB — COMPREHENSIVE METABOLIC PANEL
ALT: 24 U/L (ref 0–50)
AST: 25 U/L (ref 0–50)
Albumin: 4.3 g/dl (ref 3.5–5.2)
Alk Phosphatase: 51 U/L (ref 40–130)
BUN: 17 mg/dl (ref 6–20)
CO2: 33 mmol/L — ABNORMAL HIGH (ref 22–29)
Calcium: 8.9 mg/dl (ref 8.4–10.2)
Chloride: 102 mmol/L (ref 98–107)
Creatinine: 1.4 mg/dl — ABNORMAL HIGH (ref 0.7–1.3)
Glucose: 74 mg/dl (ref 70–99)
Potassium: 4.4 mmol/L (ref 3.5–5.3)
Sodium: 142 mmol/L (ref 135–145)
Total Bilirubin: 0.6 mg/dl (ref 0.0–1.2)
Total Protein: 7.1 g/dl (ref 6.4–8.3)

## 2021-08-16 MED ORDER — SODIUM CHLORIDE 0.9 % IV BOLUS
0.9 % | Freq: Once | INTRAVENOUS | Status: AC
Start: 2021-08-16 — End: 2021-08-17

## 2021-08-29 ENCOUNTER — Encounter

## 2021-08-30 MED ORDER — TESTOSTERONE 25 MG/2.5GM (1%) TD GEL
25 MG/2.5GM (1%) | TRANSDERMAL | 1 refills | Status: DC
Start: 2021-08-30 — End: 2021-11-15

## 2021-09-11 ENCOUNTER — Encounter

## 2021-09-12 ENCOUNTER — Ambulatory Visit: Admit: 2021-09-12 | Discharge: 2021-09-12 | Payer: BLUE CROSS/BLUE SHIELD | Primary: Hematology & Oncology

## 2021-09-12 ENCOUNTER — Encounter

## 2021-09-12 ENCOUNTER — Ambulatory Visit
Admit: 2021-09-12 | Discharge: 2021-09-12 | Payer: BLUE CROSS/BLUE SHIELD | Attending: Hematology & Oncology | Primary: Hematology & Oncology

## 2021-09-12 DIAGNOSIS — D751 Secondary polycythemia: Secondary | ICD-10-CM

## 2021-09-12 LAB — CBC WITH AUTO DIFFERENTIAL
Absolute Baso #: 0 10*3/uL (ref 0.0–0.2)
Absolute Eos #: 0.1 10*3/uL (ref 0.0–0.7)
Absolute Lymph #: 1.1 10*3/uL (ref 0.6–3.4)
Basophils %: 0.9 % (ref 0.0–2.5)
Eosinophils %: 3.1 % (ref 0.0–7.0)
Hematocrit: 51.8 % — ABNORMAL HIGH (ref 37.2–47.8)
Hemoglobin: 16 g/dL (ref 12.5–16.1)
Lymphocytes: 26.1 % (ref 10.0–50.0)
MCH: 29 pg (ref 27–33)
MCHC: 30.8 g/dL — ABNORMAL LOW (ref 32.5–34.8)
MCV: 94 fL (ref 81–97)
MPV: 6.48 fL — ABNORMAL LOW (ref 7.00–10.00)
Monocytes Absolute: 0.3 10*3/uL (ref 0.0–0.9)
Monocytes: 6.9 % (ref 0.0–12.0)
Neutrophils %: 63 % (ref 37.0–80.0)
Neutrophils Absolute: 2.6 10*3/uL (ref 2.0–6.9)
Platelets: 189 10*3/uL (ref 142–424)
RBC: 5.53 M/uL (ref 4.00–5.58)
RDW: 12.4 % (ref 11.6–14.8)
WBC: 4.1 10*3/uL (ref 3.2–12.0)

## 2021-09-12 LAB — COMPREHENSIVE METABOLIC PANEL
ALT: 22 U/L (ref 0–50)
AST: 23 U/L (ref 0–50)
Albumin: 4.5 g/dl (ref 3.5–5.2)
Alk Phosphatase: 69 U/L (ref 40–130)
BUN: 24 mg/dl — ABNORMAL HIGH (ref 6–20)
CO2: 31 mmol/L — ABNORMAL HIGH (ref 22–29)
Calcium: 9.3 mg/dl (ref 8.4–10.2)
Chloride: 103 mmol/L (ref 98–107)
Creatinine: 1.4 mg/dl — ABNORMAL HIGH (ref 0.7–1.3)
Glucose: 89 mg/dl (ref 70–99)
Potassium: 4.8 mmol/L (ref 3.5–5.3)
Sodium: 139 mmol/L (ref 135–145)
Total Bilirubin: 0.6 mg/dl (ref 0.0–1.2)
Total Protein: 7.6 g/dl (ref 6.4–8.3)

## 2021-09-12 NOTE — Progress Notes (Signed)
Progress Note    Date:09/12/2021        Patient Name:Kevin Richards     Date of Birth:03/04/1966     Age:56 y.o.    Subjective   Interval History Status: not changed.   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  He had concerns that he was continuing to have erythrocytosis from testosterone and wondering if it could be from another diagnosis.  I explained the differential and how we rule those out over the years to him.  He had a renal ultrasound for kidney stone which would have cleared him from having a renal mass.  He saw nephrology last week regarding his elevated creatinine 1.4.  Its been stable for quite some time but he has stage III chronic renal disease.  He is very concerned about this and the effect of testosterone on his cholesterol which has increased over the last few years.  His primary care provider has left practice and he is trying to get established with a new one this summer.  In the interim have addressed these concerns with him.  He has a longstanding history of PNH as a child and started testosterone at that time.  He has been on it since then since his body is no longer producing testosterone.  He decreased his dosage from 5 packs of androgen to 4 April 18 so I will update a testosterone level today to make sure it is within normal range.  His goal was to see if it had a positive effect on his cholesterol and if it had a positive effect on his hematocrit.  His hematocrit is 51.8 today and his phlebotomy threshold is 53.    Oncology History    No history exists.       Review of Systems   Review of Systems   Constitutional:  Negative for fever.   HENT:  Negative for mouth sores and trouble swallowing.    Respiratory:  Negative for cough, chest tightness and shortness of breath.    Cardiovascular:  Negative for chest pain and leg swelling.   Gastrointestinal:  Negative for abdominal distention, abdominal pain, blood in stool,  constipation, diarrhea, nausea and vomiting.   Genitourinary:  Negative for dysuria.   Skin:  Negative for color change.   Neurological:  Negative for dizziness, speech difficulty and headaches.   Hematological:  Negative for adenopathy.   Psychiatric/Behavioral:  Negative for agitation and confusion.       Medications   Scheduled Meds:   Continuous Infusions:   PRN Meds:     Past History    Past Medical History:   has a past medical history of Hypogonadism in male.    Social History:   reports that he has never smoked. He has never used smokeless tobacco. He reports that he does not drink alcohol and does not use drugs.     Family History: No family history on file.    Physical Examination      Vitals:  BP 125/84 (Site: Right Upper Arm, Position: Sitting, Cuff Size: Large Adult)   Pulse 70   Temp 98.3 F (36.8 C) (Oral)   Ht 6' (1.829 m)   Wt 217 lb (98.4 kg)   SpO2 97%   BMI 29.43 kg/m    No flowsheet data found.    Physical Exam  Constitutional:       General: He is not in acute distress.  Cardiovascular:  Rate and Rhythm: Normal rate and regular rhythm.      Heart sounds: No murmur heard.  Pulmonary:      Effort: Pulmonary effort is normal.      Breath sounds: No wheezing, rhonchi or rales.   Abdominal:      General: There is no distension.      Tenderness: There is no abdominal tenderness.   Musculoskeletal:         General: No swelling.   Lymphadenopathy:      Cervical: No cervical adenopathy.   Neurological:      General: No focal deficit present.      Mental Status: He is alert and oriented to person, place, and time.        Labs/Imaging/Diagnostics   Labs:  CBC:  Recent Labs     09/12/21  1249   WBC 4.1   RBC 5.53   HGB 16.0   HCT 51.8*   MCV 94   RDW 12.4   PLT 189     CHEMISTRIES:  Recent Labs     09/12/21  1249   NA 139   K 4.8   CL 103   CO2 31*   BUN 24*   CREATININE 1.4*   GLUCOSE 89     PT/INR:No results for input(s): PROTIME, INR in the last 72 hours.  APTT:No results for input(s): APTT  in the last 72 hours.  LIVER PROFILE:  Recent Labs     09/12/21  1249   AST 23   ALT 22   BILITOT 0.6   ALKPHOS 69       Imaging Last 24 Hours:  No results found.      Assessment        1. Erythrocytosis [D75.1 (ICD-10-CM)]         Plan:        The patient does not require phlebotomy today.  I will see him again in 3 months since he has made some changes to his testosterone dosage.  We will update a testosterone level today.  He will go back to 5 packs if it is below the normal range and continue with this dose if it is within the normal range.  We will continue to do his CBC monthly and do phlebotomy if his hematocrit is over 53.    Electronically signed by Belinda Fisher, PA-C on 09/12/21 at 1:56 PM EDT

## 2021-09-13 LAB — FERRITIN: Ferritin: 14 ng/mL — ABNORMAL LOW (ref 22–415)

## 2021-09-13 LAB — TESTOSTERONE: Testosterone: 636 ng/dL (ref 193.0–740.0)

## 2021-10-15 NOTE — Telephone Encounter (Signed)
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 He has stage 3 CKD. Reported to be eating about 150g protein/day. RD referral placed for nutrition education on protein needs and high cholesterol levels.     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 He states he was eating 150-200g protein/day. He states he used to drink creatine supplements, which he has since stopped. He states after he found out about his kidney problems he has cut down on his protein intake. He drinks 480 oz of water/day. He drinks electrolyte powder. Kevin Richards states he eats 2 meals/day, no snacks. He aims to eat less than 100g protein/day now. He is concerned with his potassium intake. RD educated Kevin Richards and told him that his kidneys are not at a state where he needs to be watching his potassium intake. Kevin Richards reports that he has tried to follow the keto diet and has cut out on red meat. He aims to eat 30 g fiber/day.    Nutrition-related medications:  Folic acid  Nutrition-related Labs: Reviewed    Anthropometrics:   Ht Readings from Last 1 Encounters:   09/12/21 6' (1.829 m)       Wt Readings from Last 1 Encounters:   09/12/21 217 lb (98.4 kg)      Ideal body weight: 77.6 kg (171 lb 1.2 oz)     Weight History:   UBW: 215# - No weight changes noted. Kevin Richards would like to lose weight    Wt Readings from Last 10 Encounters:   09/12/21 217 lb (98.4 kg)   05/30/21 217 lb (98.4 kg)   05/21/21 216 lb (98 kg)        Nutrition Focused Physical Exam: Deferred, telephone call    Estimated Nutrition Needs:  EER  20 kcal/kg : 4825-0037 kcal/day  EPR (0.8-1.0 g/kg): 79-98 g pro/day  Carbohydrate (50% EER): 246-246 g  carbohydrate per day  Fluid: (2000) ml/day or per MD    Interventions: Educated on protein needs with stage 3 CKD. Educated on different MNT guidelines for the stages of CKD. Educated on adequate fluid intake and fiber intake. Educated Kevin Richards on foods to help lower cholesterol levels. Provided community resources and education groups for pre-renal stages.     Patient/family/caregiver may reach out in the interim. RD contact information has been provided. RD will remain available PRN.      Kevin Richards, RD, LD  Registered Dietitian  [facility]  [facility]  [address]  [place], [place] [address]  [phone] O  [phone] F

## 2021-11-01 NOTE — Telephone Encounter (Signed)
Patient will call back need to check his calendar.

## 2021-11-01 NOTE — Telephone Encounter (Signed)
Patient is requesting a call back to reschedule his 11/19/21 appointment. Patient would like an appointment on 12/26/21 close to 1 pm if possible.

## 2021-11-06 LAB — CBC/DIFF AMBIGUOUS DEFAULT
Basophils %: 1 %
Basophils Absolute: 0 10*3/uL (ref 0.0–0.2)
Eosinophils %: 2 %
Eosinophils Absolute: 0.1 10*3/uL (ref 0.0–0.4)
Hematocrit: 50.6 % (ref 37.5–51.0)
Hemoglobin: 16.6 g/dL (ref 13.0–17.7)
Immature Grans (Abs): 0 10*3/uL (ref 0.0–0.1)
Immature Granulocytes: 0 %
Lymphocytes %: 23 %
Lymphocytes Absolute: 1.1 10*3/uL (ref 0.7–3.1)
MCH: 29.8 pg (ref 26.6–33.0)
MCHC: 32.8 g/dL (ref 31.5–35.7)
MCV: 91 fL (ref 79–97)
Monocytes %: 8 %
Monocytes Absolute: 0.4 10*3/uL (ref 0.1–0.9)
Neutrophils %: 66 %
Neutrophils Absolute: 3.1 10*3/uL (ref 1.4–7.0)
Platelets: 165 10*3/uL (ref 150–450)
RBC: 5.57 x10E6/uL (ref 4.14–5.80)
RDW: 14 % (ref 11.6–15.4)
WBC: 4.6 10*3/uL (ref 3.4–10.8)

## 2021-11-06 LAB — TESTOSTERONE: Testosterone: >1500 ng/dL — ABNORMAL HIGH (ref 264–916)

## 2021-11-09 LAB — FECAL DNA COLORECTAL CANCER SCREENING (COLOGUARD): FIT-DNA (Cologuard): NEGATIVE

## 2021-11-09 NOTE — Telephone Encounter (Signed)
Patient would like to reschedule 8/21 appointment. Please assist.

## 2021-11-13 NOTE — Telephone Encounter (Signed)
1st Attempt, left message for patient to call  back.

## 2021-11-14 NOTE — Telephone Encounter (Signed)
Appointment re-scheduled to 8/17 VV.

## 2021-11-14 NOTE — Telephone Encounter (Signed)
Appointment re-scheduled to 8/17 VV.

## 2021-11-15 ENCOUNTER — Telehealth
Admit: 2021-11-15 | Discharge: 2021-11-15 | Payer: BLUE CROSS/BLUE SHIELD | Attending: "Endocrinology | Primary: Hematology & Oncology

## 2021-11-15 DIAGNOSIS — E291 Testicular hypofunction: Secondary | ICD-10-CM

## 2021-11-15 MED ORDER — TESTOSTERONE 25 MG/2.5GM (1%) TD GEL
25 MG/2.5GM (1%) | TRANSDERMAL | 1 refills | Status: AC
Start: 2021-11-15 — End: 2022-05-15

## 2021-11-15 NOTE — Progress Notes (Signed)
This is a virtual visit done by video and audio components. Patient consent was obtained.       Chief Complaint   Patient presents with    Follow-up     Hygonadism  Labs done8-09-2021       HPI:    Kevin Richards (DOB:  1965-09-13) is a 56 y.o. 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 At one point, the patient was hospitalized and was given ATG. The patient has not  experienced many symptoms of PNH through the years, but has required treatment with testosterone for hypogonadism. He  moved to [place] around 2015. I increased the dose of his testosterone to 5 packets per day and since then he has felt much  better. He was able to exercise and lose significant amount of weight. Patient reduced his dose of testosterone from 5 packets to 4 packets a day equivalent to 100 mg/day since the last visit because of having polycythemia and having to get multiple phlebotomy visits.  He had labs prior to the appointment and wants to discuss the results but believes the increase in serum testosterone that was detected to be caused by contamination, as the lab was drawn from the same arm where he applied the testosterone gel and was done 1 hour after application.    Past Medical History:   Diagnosis Date    Hypogonadism in male         History reviewed. No pertinent surgical history.     Outpatient Medications Prior to Visit   Medication Sig Dispense Refill    folic acid (FOLVITE) 1 MG tablet 1 tablet Orally Once a day for 30 day(s) 90 tablet 3    testosterone (ANDROGEL) 25 MG/2.5GM (1%) GEL 1 % gel Apply 4 packets daily on shoulders and upper abdomen. Dispense 90 day supply. Prior Authorization approved from insurance. 400 each 1    testosterone (ANDROGEL) 25 MG/2.5GM (1%) GEL 1 %  gel apply 5 packets to the upper arms and or abdomen Transdermal Once a day for 90 days 450 each 1    testosterone (ANDROGEL) 50 MG/5GM (1%) GEL 1% gel apply 2 packets, equivalent to 100 mg per day to the upper arms and or abdomen Transdermal Once a day, [id number] (Patient not taking: Reported on 05/21/2021) 180 each 1     No facility-administered medications prior to visit.        Allergies   Allergen Reactions    Gadolinium Nausea Only    Iodides      Other reaction(s): severe fever    Iodinated Contrast Media Nausea Only         reports that he has never smoked. He has never used smokeless tobacco. He reports that he does not drink alcohol and does not use drugs.     History reviewed. No pertinent family history.        ROS:  Other than what was mentioned in HPI, a thorough 11 point Review of systems was done and was negative.    There were no vitals filed for this visit.       There is no height or weight on file to calculate BMI.     PE:    General: Well-nourished, in no obvious distress  HEENT: PERRLA,  EOMI  Neck: Supple, no JVD  Thyroid: no visible goiter  Abdomen: Relaxed, nondistended  Extremities: No cyanosis, clubbing or edema  Neurological: No lateralization      Telephone on 11/01/2021   Component Date Value Ref Range Status    WBC 11/05/2021 4.6  3.4 - 10.8 x10E3/uL Final    RBC 11/05/2021 5.57  4.14 - 5.80 x10E6/uL Final    Hemoglobin 11/05/2021 16.6  13.0 - 17.7 g/dL Final    Hematocrit 11/05/2021 50.6  37.5 - 51.0 % Final    MCV 11/05/2021 91  79 - 97 fL Final    MCH 11/05/2021 29.8  26.6 - 33.0 pg Final    MCHC 11/05/2021 32.8  31.5 - 35.7 g/dL Final    RDW 11/05/2021 14.0  11.6 - 15.4 % Final    Platelets 11/05/2021 165  150 - 450 x10E3/uL Final    Neutrophils % 11/05/2021 66  Not Estab. % Final    Lymphocytes % 11/05/2021 23  Not Estab. % Final    Monocytes % 11/05/2021 8  Not Estab. % Final    Eosinophils % 11/05/2021 2  Not Estab. % Final    Basophils % 11/05/2021 1  Not Estab. % Final     Neutrophils Absolute 11/05/2021 3.1  1.4 - 7.0 x10E3/uL Final    Lymphocytes Absolute 11/05/2021 1.1  0.7 - 3.1 x10E3/uL Final    Monocytes Absolute 11/05/2021 0.4  0.1 - 0.9 x10E3/uL Final    Eosinophils Absolute 11/05/2021 0.1  0.0 - 0.4 x10E3/uL Final    Basophils Absolute 11/05/2021 0.0  0.0 - 0.2 x10E3/uL Final    Immature Granulocytes 11/05/2021 0  Not Estab. % Final    Immature Grans (Abs) 11/05/2021 0.0  0.0 - 0.1 x10E3/uL Final    Comment: A hand-written panel/profile was received from your office. In  accordance with the [facility] Ambiguous Test Code Policy dated July  2010, we have assigned CBC with Differential/Platelet, Test Code  #005009 to this request. If this is not the testing you wished to  receive on this specimen, please contact the [facility] Department to clarify the test order. We  appreciate your business.      Testosterone 11/05/2021 >1500 (H)  264 - 916 ng/dL Final    Comment: Adult male reference interval is based on a population of  healthy nonobese males (BMI <30) between 52 and 68 years old.  [place], [place] [phone]. PMID: [id number].     Orders Only on 09/12/2021   Component Date Value Ref Range Status    Testosterone 09/12/2021 636.0  193.0 - 740.0 ng/dL Final    Comment:    Test Performed at:  [facility]  [address] [place] [address]       Nurse Only on 09/12/2021   Component Date Value Ref Range Status    Ferritin 09/12/2021 14 (L)  22 - 415 ng/mL Final    Sodium 09/12/2021 139  135 - 145 mmol/L Final    Potassium 09/12/2021 4.8  3.5 - 5.3 mmol/L Final    Chloride 09/12/2021 103  98 - 107 mmol/L Final    CO2 09/12/2021 31 (H)  22 - 29 mmol/L Final    Glucose 09/12/2021 89  70 - 99 mg/dl Final    BUN 09/12/2021 24 (H)  6 - 20 mg/dl Final    Creatinine 09/12/2021 1.4 (H)  0.7 - 1.3 mg/dl Final    Calcium 09/12/2021 9.3  8.4 - 10.2 mg/dl Final    ALT 09/12/2021 22  0 - 50 U/L Final    AST 09/12/2021 23  0 -  50 U/L Final    Alk Phosphatase 09/12/2021 69  40 - 130 U/L Final    Total Bilirubin 09/12/2021 0.6  0.0 - 1.2 mg/dl Final    Total Protein 09/12/2021 7.6  6.4 - 8.3 g/dl Final    Albumin 09/12/2021 4.5  3.5 - 5.2 g/dl Final    WBC 09/12/2021 4.1  3.2 - 12.0 K/uL Final    Neutrophils Absolute 09/12/2021 2.6  2.0 - 6.9 K/uL Final    Neutrophils % 09/12/2021 63.0  37.0 - 80.0 % Final    Absolute Lymph # 09/12/2021 1.1  0.6 - 3.4 K/uL Final    Lymphocytes 09/12/2021 26.1  10.0 - 50.0 % Final    Monocytes Absolute 09/12/2021 0.3  0.0 - 0.9 K/uL Final    Monocytes 09/12/2021 6.9  0.0 - 12.0 % Final    Absolute Eos # 09/12/2021 0.1  0.0 - 0.7 K/uL Final    Eosinophils % 09/12/2021 3.1  0.0 - 7.0 % Final    Absolute Baso # 09/12/2021 0.0  0.0 - 0.2 k/uL Final    Basophils % 09/12/2021 0.9  0.0 - 2.5 % Final    RBC 09/12/2021 5.53  4.00 - 5.58 M/uL Final    Hemoglobin 09/12/2021 16.0  12.5 - 16.1 g/dL Final    Hematocrit 09/12/2021 51.8 (H)  37.2 - 47.8 % Final    MCV 09/12/2021 94  81 - 97 fL Final    MCH 09/12/2021 29  27 - 33 pg Final    MCHC 09/12/2021 30.8 (L)  32.5 - 34.8 g/dL Final    RDW 09/12/2021 12.4  11.6 - 14.8 % Final    Platelets 09/12/2021 189  142 - 424 K/uL Final    MPV 09/12/2021 6.48 (L)  7.00 - 10.00 fL Final   Nurse Only on 08/16/2021   Component Date Value Ref Range Status    Sodium 08/16/2021 142  135 - 145 mmol/L Final    Potassium 08/16/2021 4.4  3.5 - 5.3 mmol/L Final    Chloride 08/16/2021 102  98 - 107 mmol/L Final    CO2 08/16/2021 33 (H)  22 - 29 mmol/L Final    Glucose 08/16/2021 74  70 - 99 mg/dl Final    BUN 08/16/2021 17  6 - 20 mg/dl Final    Creatinine 08/16/2021 1.4 (H)  0.7 - 1.3 mg/dl Final    Calcium 08/16/2021 8.9  8.4 - 10.2 mg/dl Final    ALT 08/16/2021 24  0 - 50 U/L Final    AST 08/16/2021 25  0 - 50 U/L Final    Alk Phosphatase 08/16/2021 51  40 - 130 U/L Final    Total Bilirubin 08/16/2021 0.6  0.0 - 1.2 mg/dl Final    Total Protein 08/16/2021 7.1  6.4 - 8.3 g/dl Final     Albumin 08/16/2021 4.3  3.5 - 5.2 g/dl Final    WBC 08/16/2021 3.8  3.2 - 12.0 K/uL Final    Neutrophils Absolute 08/16/2021 2.5  2.0 - 6.9 K/uL Final    Neutrophils % 08/16/2021 65.7  37.0 - 80.0 % Final    Absolute Lymph # 08/16/2021 0.9  0.6 - 3.4 K/uL Final    Lymphocytes 08/16/2021 24.5  10.0 - 50.0 % Final    Monocytes Absolute 08/16/2021 0.2  0.0 - 0.9 K/uL Final  Monocytes 08/16/2021 6.0  0.0 - 12.0 % Final    Absolute Eos # 08/16/2021 0.1  0.0 - 0.7 K/uL Final    Eosinophils % 08/16/2021 3.0  0.0 - 7.0 % Final    Absolute Baso # 08/16/2021 0.0  0.0 - 0.2 k/uL Final    Basophils % 08/16/2021 0.9  0.0 - 2.5 % Final    RBC 08/16/2021 5.75 (H)  4.00 - 5.58 M/uL Final    Hemoglobin 08/16/2021 16.6 (H)  12.5 - 16.1 g/dL Final    Hematocrit 08/16/2021 54.5 (H)  37.2 - 47.8 % Final    MCV 08/16/2021 95  81 - 97 fL Final    MCH 08/16/2021 29  27 - 33 pg Final    MCHC 08/16/2021 30.4 (L)  32.5 - 34.8 g/dL Final    RDW 08/16/2021 12.6  11.6 - 14.8 % Final    Platelets 08/16/2021 158  142 - 424 K/uL Final    MPV 08/16/2021 6.66 (L)  7.00 - 10.00 fL Final   Nurse Only on 05/30/2021   Component Date Value Ref Range Status    Sodium 05/30/2021 140  135 - 145 mmol/L Final    Potassium 05/30/2021 4.6  3.5 - 5.3 mmol/L Final    Chloride 05/30/2021 101  98 - 107 mmol/L Final    CO2 05/30/2021 34 (H)  22 - 29 mmol/L Final    Glucose 05/30/2021 95  70 - 99 mg/dl Final    BUN 05/30/2021 31 (H)  6 - 20 mg/dl Final    Creatinine 05/30/2021 1.5 (H)  0.7 - 1.3 mg/dl Final    Calcium 05/30/2021 9.2  8.4 - 10.2 mg/dl Final    ALT 05/30/2021 24  0 - 50 U/L Final    AST 05/30/2021 23  0 - 50 U/L Final    Alk Phosphatase 05/30/2021 53  40 - 130 U/L Final    Total Bilirubin 05/30/2021 0.4  0.0 - 1.2 mg/dl Final    Total Protein 05/30/2021 7.1  6.4 - 8.3 g/dl Final    Albumin 05/30/2021 4.4  3.5 - 5.2 g/dl Final    WBC 05/30/2021 4.6  3.2 - 12.0 K/uL Final    Neutrophils Absolute 05/30/2021 3.1  2.0 - 6.9 K/uL Final    Neutrophils %  05/30/2021 67.3  37.0 - 80.0 % Final    Absolute Lymph # 05/30/2021 1.0  0.6 - 3.4 K/uL Final    Lymphocytes 05/30/2021 22.0  10.0 - 50.0 % Final    Monocytes Absolute 05/30/2021 0.3  0.0 - 0.9 K/uL Final    Monocytes 05/30/2021 7.0  0.0 - 12.0 % Final    Absolute Eos # 05/30/2021 0.1  0.0 - 0.7 K/uL Final    Eosinophils % 05/30/2021 2.9  0.0 - 7.0 % Final    Absolute Baso # 05/30/2021 0.0  0.0 - 0.2 k/uL Final    Basophils % 05/30/2021 0.9  0.0 - 2.5 % Final    RBC 05/30/2021 5.85 (H)  4.00 - 5.58 M/uL Final    Hemoglobin 05/30/2021 16.7 (H)  12.5 - 16.1 g/dL Final    Hematocrit 05/30/2021 55.9 (H)  37.2 - 47.8 % Final    MCV 05/30/2021 95  81 - 97 fL Final    MCH 05/30/2021 29  27 - 33 pg Final    MCHC 05/30/2021 29.9 (L)  32.5 - 34.8 g/dL Final    RDW 05/30/2021 13.7  11.6 - 14.8 % Final    Platelets  05/30/2021 185  142 - 424 K/uL Final    MPV 05/30/2021 7.16  7.00 - 10.00 fL Final   Orders Only on 05/21/2021   Component Date Value Ref Range Status    Testosterone 05/21/2021 678.0  193.0 - 740.0 ng/dL Final    WBC 05/21/2021 4.7  3.8 - 10.6 x10e3/mcL Final    RBC 05/21/2021 5.64 (H)  4.00 - 5.60 x10e6/mcL Final    Hemoglobin 05/21/2021 17.3  13.0 - 17.3 g/dL Final    Hematocrit 05/21/2021 51.5  38.0 - 52.0 % Final    MCV 05/21/2021 91.3  84.0 - 100.0 fL Final    MCH 05/21/2021 30.7  27.0 - 34.5 pg Final    MCHC 05/21/2021 33.6  32.0 - 36.0 g/dL Final    RDW 05/21/2021 14.0  11.0 - 16.0 % Final    Platelets 05/21/2021 162  140 - 440 x10e3/mcL Final    MPV 05/21/2021 9.5  7.2 - 13.2 fL Final    NRBC Automated 05/21/2021 0.0  0.0 - 0.2 % Final    NRBC Absolute 05/21/2021 0.000  0.000 - 0.012 x10e3/mcL Final    Neutrophils % 05/21/2021 69.0  42.0 - 74.0 % Final    Lymphocytes 05/21/2021 22.1  15.0 - 45.0 % Final    Monocytes 05/21/2021 6.2  4.0 - 12.0 % Final    Eosinophils % 05/21/2021 2.1  0.0 - 7.0 % Final    Basophils % 05/21/2021 0.4  0.0 - 2.0 % Final    Neutrophils Absolute 05/21/2021 3.2  1.6 - 7.3 x10e3/mcL  Final    Absolute Lymph # 05/21/2021 1.0  1.0 - 3.2 x10e3/mcL Final    Absolute Mono # 05/21/2021 0.3  0.3 - 1.0 x10e3/mcL Final    Absolute Eos # 05/21/2021 0.1  0.0 - 0.5 x10e3/mcL Final    Absolute Baso # 05/21/2021 0.0  0.0 - 0.2 x10e3/mcL Final    Immature Granulocytes 05/21/2021 0.2  0.0 - 0.6 % Final    Immature Grans (Abs) 05/21/2021 0.01  0.00 - 0.06 x10e3/mcL Final    Screening PSA 05/21/2021 3.660  0.000 - 4.000 ng/mL Final    Comment: PSA INTERPRETATION:    PSA measured by Roche Cobas electrochemiluminescence immunoassay "ECLIA"  methodology.    At this time, no major scientific/medical organization including the [place] and the American Urological Association have specific  recommendations in regard to prostate specific antigen (PSA) testing other  than recommending that men at age 69 and above and those who are at high risk  at age 52-45 and above be counseled in regard to the pros and cons of PSA  testing.    Guidelines for risk stratification    1.  Below 1:  Very low risk of prostate cancer  2.  1-4:       Approximately 15% risk of prostate cancer  3.  4-10:      25% risk of prostate cancer  4.  >10:       50% risk of prostate cancer    The use of PSA velocity (rate of rise of PSA over time) may also be useful in  predicting the risk of prostate cancer.  Most authorities recommend PSA  testing on at least three occasions over a period of 18 months to get an  accurate PSA velocity.    References  available by request.          ASSESSMENT/PLAN:  1. Hypogonadism in male  -     testosterone (ANDROGEL) 25 MG/2.5GM (1%) GEL 1 % gel; Apply 4 packets daily on shoulders and upper abdomen. Total daily dose 100 mg per day. Dispense 90 day supply. Prior Authorization approved from insurance., Disp-400 each, R-1Normal  -     Testosterone, Free/Tot Equilib  -     PSA Screening; Future  -     CBC with Auto Differential; Future  -     Estradiol; Future  -     Testosterone;  Future  -     Testosterone, Free/Tot Equilib; Future  2. Testicular hypofunction  3. Alopecia      Patient has been on testosterone therapy for more than 30 years.  He needs the testosterone treatment and the current method of administration is the best and most physiological.    He now feels well in general but I will check to make sure the testosterone dosage is adequate for him and if it is then we will have the patient follow-up as usual in 6 months.    Return in about 6 months (around 05/18/2022).           An electronic signature was used to authenticate this note.    --Neta Mends, MD

## 2021-12-19 ENCOUNTER — Encounter: Attending: Hematology & Oncology | Primary: Hematology & Oncology

## 2021-12-19 ENCOUNTER — Encounter: Primary: Hematology & Oncology

## 2021-12-25 ENCOUNTER — Encounter

## 2021-12-26 ENCOUNTER — Inpatient Hospital Stay: Payer: BLUE CROSS/BLUE SHIELD | Primary: Hematology & Oncology

## 2021-12-26 ENCOUNTER — Encounter

## 2021-12-26 ENCOUNTER — Inpatient Hospital Stay: Admit: 2021-12-26 | Discharge: 2021-12-26 | Payer: BLUE CROSS/BLUE SHIELD | Primary: Hematology & Oncology

## 2021-12-26 ENCOUNTER — Ambulatory Visit
Admit: 2021-12-26 | Discharge: 2021-12-26 | Payer: BLUE CROSS/BLUE SHIELD | Attending: Hematology & Oncology | Primary: Hematology & Oncology

## 2021-12-26 ENCOUNTER — Ambulatory Visit: Admit: 2021-12-26 | Discharge: 2021-12-26 | Payer: BLUE CROSS/BLUE SHIELD | Primary: Hematology & Oncology

## 2021-12-26 DIAGNOSIS — D751 Secondary polycythemia: Secondary | ICD-10-CM

## 2021-12-26 LAB — COMPREHENSIVE METABOLIC PANEL
ALT: 24 U/L (ref 0–50)
AST: 26 U/L (ref 0–50)
Albumin: 4.5 g/dl (ref 3.5–5.2)
Alk Phosphatase: 55 U/L (ref 40–130)
BUN: 25 mg/dl — ABNORMAL HIGH (ref 6–20)
CO2: 31 mmol/L — ABNORMAL HIGH (ref 22–29)
Calcium: 9.2 mg/dl (ref 8.4–10.2)
Chloride: 102 mmol/L (ref 98–107)
Creatinine: 1.3 mg/dl (ref 0.7–1.3)
Glucose: 94 mg/dl (ref 70–99)
Potassium: 4.4 mmol/L (ref 3.5–5.3)
Sodium: 140 mmol/L (ref 135–145)
Total Bilirubin: 0.6 mg/dl (ref 0.0–1.2)
Total Protein: 7.8 g/dl (ref 6.4–8.3)

## 2021-12-26 LAB — CBC WITH AUTO DIFFERENTIAL
Absolute Baso #: 0 10*3/uL (ref 0.0–0.2)
Absolute Baso #: 0 10*3/uL (ref 0.0–0.2)
Absolute Eos #: 0.1 10*3/uL (ref 0.0–0.7)
Absolute Eos #: 0.1 10*3/uL (ref 0.0–0.5)
Absolute Lymph #: 0.9 10*3/uL (ref 0.6–3.4)
Absolute Lymph #: 0.8 10*3/uL — ABNORMAL LOW (ref 1.0–3.2)
Absolute Mono #: 0.2 10*3/uL — ABNORMAL LOW (ref 0.3–1.0)
Basophils %: 1 % (ref 0.0–2.5)
Basophils %: 0.7 % (ref 0.0–2.0)
Eosinophils %: 2.6 % (ref 0.0–7.0)
Eosinophils %: 3 % (ref 0.0–7.0)
Hematocrit: 56.9 % — ABNORMAL HIGH (ref 37.2–47.8)
Hematocrit: 52 % (ref 38.0–52.0)
Hemoglobin: 17.1 g/dL — ABNORMAL HIGH (ref 12.5–16.1)
Hemoglobin: 17.6 g/dL — ABNORMAL HIGH (ref 13.0–17.3)
Immature Grans (Abs): 0.01 10*3/uL (ref 0.00–0.06)
Immature Granulocytes: 0.3 % (ref 0.0–0.6)
Lymphocytes: 27.2 % (ref 10.0–50.0)
Lymphocytes: 27.8 % (ref 15.0–45.0)
MCH: 28 pg (ref 27–33)
MCH: 30.4 pg (ref 27.0–34.5)
MCHC: 30.1 g/dL — ABNORMAL LOW (ref 32.5–34.8)
MCHC: 33.8 g/dL (ref 32.0–36.0)
MCV: 92 fL (ref 81–97)
MCV: 90 fL (ref 84.0–100.0)
MPV: 6.79 fL — ABNORMAL LOW (ref 7.00–10.00)
MPV: 10.2 fL (ref 7.2–13.2)
Monocytes Absolute: 0.2 10*3/uL (ref 0.0–0.9)
Monocytes: 7.1 % (ref 0.0–12.0)
Monocytes: 7 % (ref 4.0–12.0)
NRBC Absolute: 0 10*3/uL (ref 0.000–0.012)
NRBC Automated: 0 % (ref 0.0–0.2)
Neutrophils %: 62 % (ref 37.0–80.0)
Neutrophils %: 61.2 % (ref 42.0–74.0)
Neutrophils Absolute: 2 10*3/uL (ref 2.0–6.9)
Neutrophils Absolute: 1.8 10*3/uL (ref 1.6–7.3)
Platelets: 157 10*3/uL (ref 142–424)
Platelets: 154 10*3/uL (ref 140–440)
RBC: 6.17 M/uL — ABNORMAL HIGH (ref 4.00–5.58)
RBC: 5.78 x10e6/mcL — ABNORMAL HIGH (ref 4.00–5.60)
RDW: 13.3 % (ref 11.6–14.8)
RDW: 13.6 % (ref 11.0–16.0)
WBC: 3.3 10*3/uL (ref 3.2–12.0)
WBC: 3 10*3/uL — ABNORMAL LOW (ref 3.8–10.6)

## 2021-12-26 LAB — TESTOSTERONE: Testosterone: 570 ng/dL (ref 193.0–740.0)

## 2021-12-26 MED ORDER — SODIUM CHLORIDE 0.9 % IV BOLUS
0.9 % | Freq: Once | INTRAVENOUS | Status: DC
Start: 2021-12-26 — End: 2021-12-28

## 2021-12-26 NOTE — Progress Notes (Signed)
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  He reduced his testosterone from 5 packs a day to 4 packs a day.  He is most recent testosterone serum level was greater than 5000 although Dr. Nelda Marseille felt that this may have been elevated since he has been putting this in the antecubital fossa and he had blood drawn from that site and this may represent skin contamination.  He has been feeling well.      He has a longstanding history of PNH as a child and started testosterone at that time.  He has been on it since then since his body is no longer producing testosterone.  He decreased his dosage from 5 packs of androgen to 4 April 18 so I will update a testosterone level today to make sure it is within normal range.  His goal was to see if it had a positive effect on his cholesterol and if it had a positive effect on his hematocrit.  His hematocrit is 51.8 today and his phlebotomy threshold is 53.    ONCOLOGIC HISTORY:  Oncology History    No history exists.        PAST MEDICAL HISTORY:  Past Medical History:   Diagnosis Date    Hypogonadism in male        PAST SURGICAL HISTORY:  No past surgical history on file.    HOME MEDICATIONS:  Current Outpatient Medications   Medication Sig Dispense Refill    testosterone (ANDROGEL) 25 MG/2.5GM (1%) GEL 1 % gel Apply 4 packets daily on shoulders and upper abdomen. Total daily dose 100 mg per day. Dispense 90 day supply. Prior Authorization approved from insurance. 528 each 1    folic acid (FOLVITE) 1 MG tablet 1 tablet Orally Once a day for 30 day(s) 90 tablet 3     No current facility-administered medications for this visit.        ALLERGIES:  Gadolinium, Iodides, and Iodinated contrast media    SOCIAL HISTORY:  Social History      Socioeconomic History    Marital status: Married   Tobacco Use    Smoking status: Never    Smokeless tobacco: Never   Vaping Use    Vaping Use: Never used   Substance and Sexual Activity    Alcohol use: Never    Drug use: Never        REVIEW OF SYSTEMS:  Review of Systems   Constitutional:  Negative for activity change, appetite change, chills, fatigue, fever and unexpected weight change.   HENT:  Negative for sinus pain and sore throat.    Respiratory:  Negative for cough, chest tightness, shortness of breath and wheezing.    Cardiovascular:  Negative for chest pain and palpitations.   Gastrointestinal:  Negative for abdominal distention, abdominal pain, blood in stool, constipation, diarrhea, nausea and vomiting.   Genitourinary:  Negative for difficulty urinating, dysuria, hematuria and urgency.   Musculoskeletal:  Negative for arthralgias, joint swelling, myalgias and neck stiffness.   Skin:  Negative for rash.   Neurological:  Negative for dizziness, syncope, weakness, light-headedness, numbness and headaches.   Hematological:  Negative for adenopathy. Does not bruise/bleed easily.  Psychiatric/Behavioral:  Negative for confusion and hallucinations.             VITAL SIGNS:  BP (!) 137/93   Pulse 62   Temp 98 F (36.7 C)   Ht 6' (1.829 m)   Wt 222 lb 6.4 oz (100.9 kg)   SpO2 98%   BMI 30.16 kg/m     PHYSICAL EXAM:    Constitutional:  Normal appearance; no acute distress  Eyes:  Conjunctiva normal; eyelids normal; sclera anicteric  Ear, Nose, Throat:  External ears and nose normal; hearing grossly normal; oropharynx unremarkable  Neck:  Supple; no masses; no adenopathy  Respiratory:  Breathing comfortably; lungs clear to auscultation and percussion  Cardiovascular:  Normal heart sounds; regular rate and rhythm; no peripheral edema  Abdomen:  No masses; no tenderness; no hepatomegaly; no splenomegaly; bowel sounds normal  Lymph nodes:  No cervical, axillary, supraclavicular or inguinal  adenopathy  Skin:  No rash; no petechiae; no ecchymoses; no pallor; no cutaneous nodules  Musculoskeletal:  Normal muscle strength  Neurologic:  No focal motor or sensory deficit; normal gait; no abnormal mental status  Psychiatric:  Oriented to person time and place; mood and affect appropriate to situation; appropriate judgment and insight; memory intact    LABORATORY DATA:     CBC:  Recent Labs     12/26/21  1029   WBC 3.3   RBC 6.17*   HGB 17.1*   HCT 56.9*   MCV 92   RDW 13.3   PLT 157     CHEMISTRIES:No results for input(s): "NA", "K", "CL", "CO2", "BUN", "CREATININE", "GLUCOSE", "CA", "PHOS", "MG" in the last 72 hours.  PT/INR:No results for input(s): "PROTIME", "INR" in the last 72 hours.  APTT:No results for input(s): "APTT" in the last 72 hours.  LIVER PROFILE:No results for input(s): "AST", "ALT", "BILIDIR", "BILITOT", "ALKPHOS" in the last 72 hours.          RADIOLOGY RESULTS:     No results found.      Impression & Plan:      1. Iron overload          PLAN:  Patient has an increased Hgb and will require phlebotomy today.  He will have a repeat testosterone checked.  He will continue on the 4 packs per day but will discuss with Dr Molli Knock about reducing his dosage

## 2021-12-26 NOTE — Plan of Care (Signed)
THERAPEUTIC PHLEBOTOMY    Recent Lab Results    Lab Results   Component Value Date    WBC 3.3 12/26/2021    HGB 17.1 (H) 12/26/2021    HCT 56.9 (H) 12/26/2021    MCV 92 12/26/2021    PLT 157 12/26/2021     Lab Results   Component Value Date    FERRITIN 14 (L) 09/12/2021       Pre-phlebotomy Vital Signs     Vitals:    12/26/21 1515   BP: 128/89   Pulse: 67         Tourniquet placed and arm assessed. Area of venous access cleansed with alcohol. PIV inserted into the right antecubital site. Blood flowing well into collection bag.     Needle removed from patient's arm.  Pressure applied to patient's arm until bleeding stopped. Dry dressing applied over puncture site and secured with Coban/self-adherent wrap.    Final amount of blood removed: 500  Post Vital Signs: Refer to Doc flow sheet  Post-Hydration (If applicable):    Patient tolerated procedure well.    Discharge Instructions provided to increase fluid intake, eat regularly and don't skip any meals, change positions slowly, no strenuous activity and keep dressing intact for 2 hours.

## 2021-12-27 LAB — FERRITIN: Ferritin: 17 ng/mL — ABNORMAL LOW (ref 22–415)

## 2021-12-29 LAB — TESTOSTERONE, FREE/TOT EQUILIB
Testosterone % Free: 2.34 % (ref 1.50–4.20)
Testosterone, Free: 11.3 ng/dL (ref 5.00–21.00)
Testosterone: 483 ng/dL (ref 264–916)

## 2022-02-01 ENCOUNTER — Encounter

## 2022-02-04 MED ORDER — TESTOSTERONE 25 MG/2.5GM (1%) TD GEL
252.51 MG/2.5GM (1%) | TRANSDERMAL | 1 refills | Status: DC
Start: 2022-02-04 — End: 2022-05-16

## 2022-02-04 NOTE — Telephone Encounter (Signed)
Last seen 11/15/2021  Next appointment 05/16/2022

## 2022-03-05 ENCOUNTER — Inpatient Hospital Stay: Admit: 2022-03-05 | Discharge: 2022-03-05 | Payer: BLUE CROSS/BLUE SHIELD | Primary: Hematology & Oncology

## 2022-03-05 ENCOUNTER — Encounter

## 2022-03-05 ENCOUNTER — Ambulatory Visit: Payer: BLUE CROSS/BLUE SHIELD | Primary: Hematology & Oncology

## 2022-03-05 DIAGNOSIS — D751 Secondary polycythemia: Secondary | ICD-10-CM

## 2022-03-05 MED ORDER — SODIUM CHLORIDE 0.9 % IV BOLUS
0.9 % | Freq: Once | INTRAVENOUS | Status: DC
Start: 2022-03-05 — End: 2022-03-07

## 2022-03-05 NOTE — Plan of Care (Signed)
THERAPEUTIC PHLEBOTOMY    Recent Lab Results    Lab Results   Component Value Date    WBC 3.3 12/26/2021    HGB 17.1 (H) 12/26/2021    HCT 56.9 (H) 12/26/2021    MCV 92 12/26/2021    PLT 157 12/26/2021     Lab Results   Component Value Date    FERRITIN 17 (L) 12/26/2021       Pre-phlebotomy Vital Signs     Vitals:    03/05/22 1430   BP: (!) 149/86   Pulse: 72         Tourniquet placed and arm assessed. Area of venous access cleansed with alcohol. PIV inserted into the L antecubital site. Blood flowing well into collection bag.     Needle removed from patient's arm.  Pressure applied to patient's arm until bleeding stopped. Dry dressing applied over puncture site and secured with Coban/self-adherent wrap.    Final amount of blood removed: 500  Post Vital Signs: Refer to Doc flow sheet  Post-Hydration (If applicable):    Patient tolerated procedure well.    Discharge Instructions provided to increase fluid intake, eat regularly and don't skip any meals, change positions slowly, no strenuous activity and keep dressing intact for 2 hours.

## 2022-04-09 NOTE — Telephone Encounter (Signed)
Patient is requesting to reschedule is appt to tomorrow because he will be at the same building tomorrow at 10 and would like to know if any appointment are available at 1 or after. Please call to advise.

## 2022-04-10 ENCOUNTER — Ambulatory Visit: Admit: 2022-04-10 | Discharge: 2022-04-10 | Payer: BLUE CROSS/BLUE SHIELD | Primary: Hematology & Oncology

## 2022-04-10 ENCOUNTER — Ambulatory Visit
Admit: 2022-04-10 | Discharge: 2022-04-10 | Payer: BLUE CROSS/BLUE SHIELD | Attending: Hematology & Oncology | Primary: Hematology & Oncology

## 2022-04-10 ENCOUNTER — Encounter

## 2022-04-10 ENCOUNTER — Encounter: Primary: Hematology & Oncology

## 2022-04-10 ENCOUNTER — Inpatient Hospital Stay: Admit: 2022-04-10 | Discharge: 2022-04-10 | Payer: BLUE CROSS/BLUE SHIELD | Primary: Hematology & Oncology

## 2022-04-10 DIAGNOSIS — D751 Secondary polycythemia: Secondary | ICD-10-CM

## 2022-04-10 LAB — CBC WITH AUTO DIFFERENTIAL
Absolute Baso #: 0 10*3/uL (ref 0.0–0.2)
Absolute Eos #: 0.1 10*3/uL (ref 0.0–0.7)
Absolute Lymph #: 0.8 10*3/uL (ref 0.6–3.4)
Basophils %: 0.7 % (ref 0.0–2.5)
Eosinophils %: 2.5 % (ref 0.0–7.0)
Hematocrit: 56.1 % — ABNORMAL HIGH (ref 37.2–47.8)
Hemoglobin: 17.1 g/dL — ABNORMAL HIGH (ref 12.5–16.1)
Lymphocytes: 18.1 % (ref 10.0–50.0)
MCH: 28 pg (ref 27–33)
MCHC: 30.5 g/dL — ABNORMAL LOW (ref 32.5–34.8)
MCV: 92 fL (ref 81–97)
MPV: 5.32 fL — ABNORMAL LOW (ref 7.00–10.00)
Monocytes Absolute: 0.3 10*3/uL (ref 0.0–0.9)
Monocytes: 6.7 % (ref 0.0–12.0)
Neutrophils %: 72 % (ref 37.0–80.0)
Neutrophils Absolute: 3.3 10*3/uL (ref 2.0–6.9)
Platelets: 230 10*3/uL (ref 142–424)
RBC: 6.07 M/uL — ABNORMAL HIGH (ref 4.00–5.58)
RDW: 13 % (ref 11.6–14.8)
WBC: 4.6 10*3/uL (ref 3.2–12.0)

## 2022-04-10 MED ORDER — SODIUM CHLORIDE 0.9 % IV BOLUS
0.9 % | Freq: Once | INTRAVENOUS | Status: DC
Start: 2022-04-10 — End: 2022-04-11

## 2022-04-10 NOTE — Telephone Encounter (Signed)
Patient will keep scheduled appointment.

## 2022-04-10 NOTE — Plan of Care (Signed)
THERAPEUTIC PHLEBOTOMY    Recent Lab Results    Lab Results   Component Value Date    WBC 4.6 04/10/2022    HGB 17.1 (H) 04/10/2022    HCT 56.1 (H) 04/10/2022    MCV 92 04/10/2022    PLT 230 04/10/2022     Lab Results   Component Value Date    FERRITIN 17 (L) 12/26/2021       Pre-phlebotomy Vital Signs     There were no vitals filed for this visit.      Tourniquet placed and arm assessed. Area of venous access cleansed with alcohol. PIV inserted into the right antecubital site. Blood flowing well into collection bag.     Needle removed from patient's arm.  Pressure applied to patient's arm until bleeding stopped. Dry dressing applied over puncture site and secured with Coban/self-adherent wrap.    Final amount of blood removed: 450  Post Vital Signs: Refer to Doc flow sheet  Post-Hydration (If applicable):    Patient tolerated procedure well.    Discharge Instructions provided to increase fluid intake, eat regularly and don't skip any meals, change positions slowly, no strenuous activity and keep dressing intact for 2 hours.

## 2022-04-10 NOTE — Telephone Encounter (Signed)
1st Attempt, left message for patient to call back.

## 2022-04-10 NOTE — Progress Notes (Signed)
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  He reports he has been feeling fatigued but states this has been going on for many months or even over a year.  He is very busy with work and is not exercising as regularly as he had been.  He does not have any pain.  He continues on AndroGel at 4 packs/day.  He has begun seeing a primary care doctor in the [facility] area.  He denies any pains.  He has had no swelling or evidence of thrombosis.  He denies any headache or lightheadedness.  He has been having phlebotomy to maintain hematocrit under 52.    ONCOLOGIC HISTORY:  Oncology History    No history exists.        PAST MEDICAL HISTORY:  Past Medical History:   Diagnosis Date    Hypogonadism in male        PAST SURGICAL HISTORY:  No past surgical history on file.    HOME MEDICATIONS:  Current Outpatient Medications   Medication Sig Dispense Refill    testosterone (ANDROGEL) 25 MG/2.5GM (1%) GEL 1 % gel Apply 4 packets daily on shoulders and upper abdomen. Total daily dose 100 mg per day. Dispense 90 day supply. Prior Authorization approved from insurance. 865 each 1    folic acid (FOLVITE) 1 MG tablet 1 tablet Orally Once a day for 30 day(s) 90 tablet 3     No current facility-administered medications for this visit.        ALLERGIES:  Gadolinium, Iodides, and Iodinated contrast media    SOCIAL HISTORY:  Social History     Socioeconomic History    Marital status: Married   Tobacco Use    Smoking status: Never    Smokeless tobacco: Never   Vaping Use    Vaping Use: Never used   Substance and Sexual Activity    Alcohol use: Never    Drug use: Never        REVIEW OF SYSTEMS:  Review of Systems   Constitutional:  Positive for fatigue. Negative for activity change, appetite change, chills, fever  and unexpected weight change.   HENT:  Negative for sinus pain and sore throat.    Respiratory:  Negative for cough, chest tightness, shortness of breath and wheezing.    Cardiovascular:  Negative for chest pain and palpitations.   Gastrointestinal:  Negative for abdominal distention, abdominal pain, blood in stool, constipation, diarrhea, nausea and vomiting.   Genitourinary:  Negative for difficulty urinating, dysuria, hematuria and urgency.   Musculoskeletal:  Negative for arthralgias, joint swelling, myalgias and neck stiffness.   Skin:  Negative for rash.   Neurological:  Negative for dizziness, syncope, weakness, light-headedness, numbness and headaches.   Hematological:  Negative for adenopathy. Does not bruise/bleed easily.   Psychiatric/Behavioral:  Negative for confusion and hallucinations.             VITAL SIGNS:  BP 118/81   Pulse 75   Temp 98.2 F (36.8 C)   Wt 101 kg (222 lb 9.6 oz)   SpO2 97%   BMI 30.19 kg/m     PHYSICAL EXAM:    Constitutional:  Normal appearance; no  acute distress  Eyes:  Conjunctiva normal; eyelids normal; sclera anicteric  Ear, Nose, Throat:  External ears and nose normal; hearing grossly normal; oropharynx unremarkable  Neck:  Supple; no masses; no adenopathy  Respiratory:  Breathing comfortably; lungs clear to auscultation and percussion  Cardiovascular:  Normal heart sounds; regular rate and rhythm; no peripheral edema  Abdomen:  No masses; no tenderness; no hepatomegaly; no splenomegaly; bowel sounds normal  Lymph nodes:  No cervical, axillary, supraclavicular or inguinal adenopathy  Skin:  No rash; no petechiae; no ecchymoses; no pallor; no cutaneous nodules  Musculoskeletal:  Normal muscle strength  Neurologic:  No focal motor or sensory deficit; normal gait; no abnormal mental status  Psychiatric:  Oriented to person time and place; mood and affect appropriate to situation; appropriate judgment and insight; memory intact    LABORATORY DATA:     CBC:  Recent Labs      04/10/22  1002   WBC 4.6   RBC 6.07*   HGB 17.1*   HCT 56.1*   MCV 92   RDW 13.0   PLT 230     CHEMISTRIES:No results for input(s): "NA", "K", "CL", "CO2", "BUN", "CREATININE", "GLUCOSE", "CA", "PHOS", "MG" in the last 72 hours.  PT/INR:No results for input(s): "PROTIME", "INR" in the last 72 hours.  APTT:No results for input(s): "APTT" in the last 72 hours.  LIVER PROFILE:No results for input(s): "AST", "ALT", "BILIDIR", "BILITOT", "ALKPHOS" in the last 72 hours.          RADIOLOGY RESULTS:     No results found.      Impression & Plan:      1. Erythrocytosis    2. Iron overload          PLAN:  Patient will require phlebotomy today.  We discussed the threshold for phlebotomy and will do phlebotomy if his hematocrit is 52.0 or higher.  We discussed his fatigue and this may be due to his low ferritin.  I have advised him to take iron to 3 times a week and see whether he feels better over the next 1 to 2 months.  If he does not feel better than he will discontinue the iron.  I will see him in follow-up in 3 months.

## 2022-04-27 LAB — CBC/DIFF AMBIGUOUS DEFAULT
Basophils %: 0 %
Basophils Absolute: 0 10*3/uL (ref 0.0–0.2)
Eosinophils %: 2 %
Eosinophils Absolute: 0.1 10*3/uL (ref 0.0–0.4)
Hematocrit: 45 % (ref 37.5–51.0)
Hemoglobin: 15.1 g/dL (ref 13.0–17.7)
Immature Grans (Abs): 0 10*3/uL (ref 0.0–0.1)
Immature Granulocytes: 0 %
Lymphocytes %: 19 %
Lymphocytes Absolute: 1 10*3/uL (ref 0.7–3.1)
MCH: 31 pg (ref 26.6–33.0)
MCHC: 33.6 g/dL (ref 31.5–35.7)
MCV: 92 fL (ref 79–97)
Monocytes %: 6 %
Monocytes Absolute: 0.3 10*3/uL (ref 0.1–0.9)
Neutrophils %: 73 %
Neutrophils Absolute: 3.9 10*3/uL (ref 1.4–7.0)
Platelets: 179 10*3/uL (ref 150–450)
RBC: 4.87 x10E6/uL (ref 4.14–5.80)
RDW: 13.9 % (ref 11.6–15.4)
WBC: 5.4 10*3/uL (ref 3.4–10.8)

## 2022-04-27 LAB — ESTRADIOL: Estradiol: 22.5 pg/mL (ref 7.6–42.6)

## 2022-04-27 LAB — PSA, DIAGNOSTIC: PSA: 2.4 ng/mL (ref 0.0–4.0)

## 2022-05-02 LAB — TESTOSTERONE, FREE/TOTAL EQUILIBRIUM ULTRAFILTRATION
Testosterone % Free: 2.03 % (ref 1.50–4.20)
Testosterone, Free: 6.33 ng/dL (ref 5.00–21.00)
Testosterone: 312 ng/dL (ref 264–916)

## 2022-05-03 NOTE — Telephone Encounter (Signed)
Forward to Dr Nelda Marseille

## 2022-05-16 ENCOUNTER — Encounter
Admit: 2022-05-16 | Discharge: 2022-05-16 | Payer: BLUE CROSS/BLUE SHIELD | Attending: "Endocrinology | Primary: Hematology & Oncology

## 2022-05-16 DIAGNOSIS — E291 Testicular hypofunction: Secondary | ICD-10-CM

## 2022-05-16 MED ORDER — TESTOSTERONE 30 MG/ACT TD SOLN
30 MG/ACT | TRANSDERMAL | 1 refills | Status: AC
Start: 2022-05-16 — End: 2022-11-14

## 2022-05-16 NOTE — Telephone Encounter (Signed)
Pt's returning's Kevin Richards's missed call regarding medication    Please advise

## 2022-05-16 NOTE — Progress Notes (Unsigned)
This is a virtual visit done by video and audio components. Patient consent was obtained.       No chief complaint on file.      HPI:    Kevin Richards (DOB:  10-11-1965) is a 57 y.o. 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 The patient  was initially diagnosed to have paroxysmal nocturnal hemoglobinuria at the age of 84, and was on various medications  including prednisone and halotestin. At one point, the patient was hospitalized and was given ATG. The patient has not  experienced many symptoms of PNH through the years, but has required treatment with testosterone for hypogonadism. He  moved to [place] around 2015. I increased the dose of his testosterone to 5 packets per day and since then he has felt much  better. He was able to exercise and lose significant amount of weight. Patient reduced his dose of testosterone from 5 packets to 4 packets a day equivalent to 100 mg/day since the last visit because of having polycythemia and having to get multiple phlebotomy visits.  He had labs prior to the appointment and wants to discuss the results but believes the increase in serum testosterone that was detected to be caused by contamination, as the lab was drawn from the same arm where he applied the testosterone gel and was done 1 hour after application.    Past Medical History:   Diagnosis Date    Hypogonadism in male         No past surgical history on file.     Outpatient Medications Prior to Visit   Medication Sig Dispense Refill    testosterone (ANDROGEL) 25 MG/2.5GM (1%) GEL 1 % gel Apply 4 packets daily on shoulders and upper abdomen. Total daily dose 100 mg per day. Dispense 90 day supply. Prior Authorization approved from insurance. 400 each 1    folic acid (FOLVITE) 1 MG tablet 1 tablet Orally Once a day for 30 day(s) 90 tablet 3     No facility-administered medications prior to visit.        Allergies   Allergen Reactions     Gadolinium Nausea Only    Iodides      Other reaction(s): severe fever    Iodinated Contrast Media Nausea Only         reports that he has never smoked. He has never used smokeless tobacco. He reports that he does not drink alcohol and does not use drugs.     No family history on file.        ROS:  Other than what was mentioned in HPI, a thorough 11 point Review of systems was done and was negative.    There were no vitals filed for this visit.       There is no height or weight on file to calculate BMI.     PE:    General: Well-nourished, in no obvious distress  HEENT: PERRLA, EOMI  Neck: Supple, no JVD  Thyroid: no visible goiter  Abdomen: Relaxed, nondistended  Extremities: No cyanosis, clubbing or edema  Neurological: No lateralization      Nurse Only on 04/10/2022   Component Date Value Ref Range Status    WBC 04/10/2022 4.6  3.2 - 12.0 K/uL Final    Neutrophils Absolute 04/10/2022 3.3  2.0 - 6.9 K/uL Final    Neutrophils % 04/10/2022 72.0  37.0 - 80.0 % Final  Absolute Lymph # 04/10/2022 0.8  0.6 - 3.4 K/uL Final    Lymphocytes 04/10/2022 18.1  10.0 - 50.0 % Final    Monocytes Absolute 04/10/2022 0.3  0.0 - 0.9 K/uL Final    Monocytes 04/10/2022 6.7  0.0 - 12.0 % Final    Absolute Eos # 04/10/2022 0.1  0.0 - 0.7 K/uL Final    Eosinophils % 04/10/2022 2.5  0.0 - 7.0 % Final    Absolute Baso # 04/10/2022 0.0  0.0 - 0.2 k/uL Final    Basophils % 04/10/2022 0.7  0.0 - 2.5 % Final    RBC 04/10/2022 6.07 (H)  4.00 - 5.58 M/uL Final    Hemoglobin 04/10/2022 17.1 (H)  12.5 - 16.1 g/dL Final    Hematocrit 66/08/3014 56.1 (H)  37.2 - 47.8 % Final    MCV 04/10/2022 92  81 - 97 fL Final    MCH 04/10/2022 28  27 - 33 pg Final    MCHC 04/10/2022 30.5 (L)  32.5 - 34.8 g/dL Final    RDW 04/09/3233 13.0  11.6 - 14.8 % Final    Platelets 04/10/2022 230  142 - 424 K/uL Final    MPV 04/10/2022 5.32 (L)  7.00 - 10.00 fL Final   Telephone on 04/09/2022   Component Date Value Ref Range Status    WBC 04/26/2022 5.4  3.4 - 10.8  x10E3/uL Final    RBC 04/26/2022 4.87  4.14 - 5.80 x10E6/uL Final    Hemoglobin 04/26/2022 15.1  13.0 - 17.7 g/dL Final    Hematocrit 57/32/2025 45.0  37.5 - 51.0 % Final    MCV 04/26/2022 92  79 - 97 fL Final    MCH 04/26/2022 31.0  26.6 - 33.0 pg Final    MCHC 04/26/2022 33.6  31.5 - 35.7 g/dL Final    RDW 42/70/6237 13.9  11.6 - 15.4 % Final    Platelets 04/26/2022 179  150 - 450 x10E3/uL Final    Neutrophils % 04/26/2022 73  Not Estab. % Final    Lymphocytes % 04/26/2022 19  Not Estab. % Final    Monocytes % 04/26/2022 6  Not Estab. % Final    Eosinophils % 04/26/2022 2  Not Estab. % Final    Basophils % 04/26/2022 0  Not Estab. % Final    Neutrophils Absolute 04/26/2022 3.9  1.4 - 7.0 x10E3/uL Final    Lymphocytes Absolute 04/26/2022 1.0  0.7 - 3.1 x10E3/uL Final    Monocytes Absolute 04/26/2022 0.3  0.1 - 0.9 x10E3/uL Final    Eosinophils Absolute 04/26/2022 0.1  0.0 - 0.4 x10E3/uL Final    Basophils Absolute 04/26/2022 0.0  0.0 - 0.2 x10E3/uL Final    Immature Granulocytes 04/26/2022 0  Not Estab. % Final    Immature Grans (Abs) 04/26/2022 0.0  0.0 - 0.1 x10E3/uL Final    Comment: A hand-written panel/profile was received from your office. In  accordance with the [facility] Ambiguous Test Code Policy dated July  2003, we have assigned CBC with Differential/Platelet, Test Code  #005009 to this request. If this is not the testing you wished to  receive on this specimen, please contact the [facility] Department to clarify the test order. We  appreciate your business.      Estradiol 04/26/2022 22.5  7.6 - 42.6 pg/mL Final    Roche ECLIA methodology    PSA 04/26/2022 2.4  0.0 - 4.0 ng/mL Final    Comment: Roche ECLIA methodology.  According to the American Urological Association, Serum PSA should  decrease and remain at undetectable levels after radical  prostatectomy. The AUA defines biochemical recurrence as an initial  PSA value 0.2 ng/mL or greater followed by a subsequent  confirmatory  PSA value 0.2 ng/mL or greater.  Values obtained with different assay methods or kits cannot be used  interchangeably. Results cannot be interpreted as absolute evidence  of the presence or absence of malignant disease.      Testosterone 04/26/2022 312  264 - 916 ng/dL Final    Comment: Adult male reference interval is based on a population of  healthy nonobese males (BMI <30) between 28 and 42 years old.  Travison, et.al. JCEM [phone]. PMID: [id number].      Testosterone, Free 04/26/2022 6.33  5.00 - 21.00 ng/dL Final    Testosterone % Free 04/26/2022 2.03  1.50 - 4.20 % Final   Nurse Only on 12/26/2021   Component Date Value Ref Range Status    Ferritin 12/26/2021 17 (L)  22 - 415 ng/mL Final    Sodium 12/26/2021 140  135 - 145 mmol/L Final    Potassium 12/26/2021 4.4  3.5 - 5.3 mmol/L Final    Chloride 12/26/2021 102  98 - 107 mmol/L Final    CO2 12/26/2021 31 (H)  22 - 29 mmol/L Final    Glucose 12/26/2021 94  70 - 99 mg/dl Final    BUN 62/13/0865 25 (H)  6 - 20 mg/dl Final    Creatinine 78/46/9629 1.3  0.7 - 1.3 mg/dl Final    Calcium 52/84/1324 9.2  8.4 - 10.2 mg/dl Final    ALT 40/12/2723 24  0 - 50 U/L Final    AST 12/26/2021 26  0 - 50 U/L Final    Alk Phosphatase 12/26/2021 55  40 - 130 U/L Final    Total Bilirubin 12/26/2021 0.6  0.0 - 1.2 mg/dl Final    Total Protein 12/26/2021 7.8  6.4 - 8.3 g/dl Final    Albumin 36/64/4034 4.5  3.5 - 5.2 g/dl Final    WBC 74/25/9563 3.3  3.2 - 12.0 K/uL Final    Neutrophils Absolute 12/26/2021 2.0  2.0 - 6.9 K/uL Final    Neutrophils % 12/26/2021 62.0  37.0 - 80.0 % Final    Absolute Lymph # 12/26/2021 0.9  0.6 - 3.4 K/uL Final    Lymphocytes 12/26/2021 27.2  10.0 - 50.0 % Final    Monocytes Absolute 12/26/2021 0.2  0.0 - 0.9 K/uL Final    Monocytes 12/26/2021 7.1  0.0 - 12.0 % Final    Absolute Eos # 12/26/2021 0.1  0.0 - 0.7 K/uL Final    Eosinophils % 12/26/2021 2.6  0.0 - 7.0 % Final    Absolute Baso # 12/26/2021 0.0  0.0 - 0.2 k/uL Final     Basophils % 12/26/2021 1.0  0.0 - 2.5 % Final    RBC 12/26/2021 6.17 (H)  4.00 - 5.58 M/uL Final    Hemoglobin 12/26/2021 17.1 (H)  12.5 - 16.1 g/dL Final    Hematocrit 87/56/4332 56.9 (H)  37.2 - 47.8 % Final    MCV 12/26/2021 92  81 - 97 fL Final    MCH 12/26/2021 28  27 - 33 pg Final    MCHC 12/26/2021 30.1 (L)  32.5 - 34.8 g/dL Final    RDW 95/18/8416 13.3  11.6 - 14.8 % Final    Platelets 12/26/2021 157  142 - 424 K/uL Final  MPV 12/26/2021 6.79 (L)  7.00 - 10.00 fL Final   Orders Only on 12/26/2021   Component Date Value Ref Range Status    Testosterone 12/26/2021 570.0  193.0 - 740.0 ng/dL Final    WBC 78/29/5621 3.0 (L)  3.8 - 10.6 x10e3/mcL Final    RBC 12/26/2021 5.78 (H)  4.00 - 5.60 x10e6/mcL Final    Hemoglobin 12/26/2021 17.6 (H)  13.0 - 17.3 g/dL Final    Hematocrit 30/86/5784 52.0  38.0 - 52.0 % Final    MCV 12/26/2021 90.0  84.0 - 100.0 fL Final    MCH 12/26/2021 30.4  27.0 - 34.5 pg Final    MCHC 12/26/2021 33.8  32.0 - 36.0 g/dL Final    RDW 69/62/9528 13.6  11.0 - 16.0 % Final    Platelets 12/26/2021 154  140 - 440 x10e3/mcL Final    MPV 12/26/2021 10.2  7.2 - 13.2 fL Final    NRBC Automated 12/26/2021 0.0  0.0 - 0.2 % Final    NRBC Absolute 12/26/2021 0.000  0.000 - 0.012 x10e3/mcL Final    Neutrophils % 12/26/2021 61.2  42.0 - 74.0 % Final    Lymphocytes 12/26/2021 27.8  15.0 - 45.0 % Final    Monocytes 12/26/2021 7.0  4.0 - 12.0 % Final    Eosinophils % 12/26/2021 3.0  0.0 - 7.0 % Final    Basophils % 12/26/2021 0.7  0.0 - 2.0 % Final    Neutrophils Absolute 12/26/2021 1.8  1.6 - 7.3 x10e3/mcL Final    Absolute Lymph # 12/26/2021 0.8 (L)  1.0 - 3.2 x10e3/mcL Final    Absolute Mono # 12/26/2021 0.2 (L)  0.3 - 1.0 x10e3/mcL Final    Absolute Eos # 12/26/2021 0.1  0.0 - 0.5 x10e3/mcL Final    Absolute Baso # 12/26/2021 0.0  0.0 - 0.2 x10e3/mcL Final    Immature Granulocytes 12/26/2021 0.3  0.0 - 0.6 % Final    Immature Grans (Abs) 12/26/2021 0.01  0.00 - 0.06 x10e3/mcL Final   Telemedicine on  11/15/2021   Component Date Value Ref Range Status    Testosterone 12/26/2021 483  264 - 916 ng/dL Final    Comment: Adult male reference interval is based on a population of  healthy nonobese males (BMI <30) between 90 and 31 years  old. Travison, et.al. JCEM [phone]. PMID:  [id number].      Testosterone, Free 12/26/2021 11.30  5.00 - 21.00 ng/dL Final    Testosterone % Free 12/26/2021 2.34  1.50 - 4.20 % Final    Comment: Performed At: [facility]  [address] [place], [place] [id number]  Jolene Schimke MD [id number]     Telephone on 11/01/2021   Component Date Value Ref Range Status    WBC 11/05/2021 4.6  3.4 - 10.8 x10E3/uL Final    RBC 11/05/2021 5.57  4.14 - 5.80 x10E6/uL Final    Hemoglobin 11/05/2021 16.6  13.0 - 17.7 g/dL Final    Hematocrit 66/08/3014 50.6  37.5 - 51.0 % Final    MCV 11/05/2021 91  79 - 97 fL Final    MCH 11/05/2021 29.8  26.6 - 33.0 pg Final    MCHC 11/05/2021 32.8  31.5 - 35.7 g/dL Final    RDW 04/09/3233 14.0  11.6 - 15.4 % Final    Platelets 11/05/2021 165  150 - 450 x10E3/uL Final    Neutrophils % 11/05/2021 66  Not Estab. % Final    Lymphocytes % 11/05/2021 23  Not  Estab. % Final    Monocytes % 11/05/2021 8  Not Estab. % Final    Eosinophils % 11/05/2021 2  Not Estab. % Final    Basophils % 11/05/2021 1  Not Estab. % Final    Neutrophils Absolute 11/05/2021 3.1  1.4 - 7.0 x10E3/uL Final    Lymphocytes Absolute 11/05/2021 1.1  0.7 - 3.1 x10E3/uL Final    Monocytes Absolute 11/05/2021 0.4  0.1 - 0.9 x10E3/uL Final    Eosinophils Absolute 11/05/2021 0.1  0.0 - 0.4 x10E3/uL Final    Basophils Absolute 11/05/2021 0.0  0.0 - 0.2 x10E3/uL Final    Immature Granulocytes 11/05/2021 0  Not Estab. % Final    Immature Grans (Abs) 11/05/2021 0.0  0.0 - 0.1 x10E3/uL Final    Comment: A hand-written panel/profile was received from your office. In  accordance with the [facility] Ambiguous Test Code Policy dated July  2003, we have assigned CBC with Differential/Platelet, Test  Code  #005009 to this request. If this is not the testing you wished to  receive on this specimen, please contact the [facility] Department to clarify the test order. We  appreciate your business.      Testosterone 11/05/2021 >1500 (H)  264 - 916 ng/dL Final    Comment: Adult male reference interval is based on a population of  healthy nonobese males (BMI <30) between 76 and 22 years old.  Travison, et.al. JCEM [phone]. PMID: [id number].     Orders Only on 09/12/2021   Component Date Value Ref Range Status    Testosterone 09/12/2021 636.0  193.0 - 740.0 ng/dL Final    Comment:    Test Performed at:  [facility]  [address] [place], [place] [address]       Nurse Only on 09/12/2021   Component Date Value Ref Range Status    Ferritin 09/12/2021 14 (L)  22 - 415 ng/mL Final    Sodium 09/12/2021 139  135 - 145 mmol/L Final    Potassium 09/12/2021 4.8  3.5 - 5.3 mmol/L Final    Chloride 09/12/2021 103  98 - 107 mmol/L Final    CO2 09/12/2021 31 (H)  22 - 29 mmol/L Final    Glucose 09/12/2021 89  70 - 99 mg/dl Final    BUN 86/57/8469 24 (H)  6 - 20 mg/dl Final    Creatinine 62/95/2841 1.4 (H)  0.7 - 1.3 mg/dl Final    Calcium 32/44/0102 9.3  8.4 - 10.2 mg/dl Final    ALT 72/53/6644 22  0 - 50 U/L Final    AST 09/12/2021 23  0 - 50 U/L Final    Alk Phosphatase 09/12/2021 69  40 - 130 U/L Final    Total Bilirubin 09/12/2021 0.6  0.0 - 1.2 mg/dl Final    Total Protein 09/12/2021 7.6  6.4 - 8.3 g/dl Final    Albumin 03/47/4259 4.5  3.5 - 5.2 g/dl Final    WBC 56/38/7564 4.1  3.2 - 12.0 K/uL Final    Neutrophils Absolute 09/12/2021 2.6  2.0 - 6.9 K/uL Final    Neutrophils % 09/12/2021 63.0  37.0 - 80.0 % Final    Absolute Lymph # 09/12/2021 1.1  0.6 - 3.4 K/uL Final    Lymphocytes 09/12/2021 26.1  10.0 - 50.0 % Final    Monocytes Absolute 09/12/2021 0.3  0.0 - 0.9 K/uL Final    Monocytes 09/12/2021 6.9  0.0 - 12.0 % Final  Absolute Eos # 09/12/2021 0.1  0.0 -  0.7 K/uL Final    Eosinophils % 09/12/2021 3.1  0.0 - 7.0 % Final    Absolute Baso # 09/12/2021 0.0  0.0 - 0.2 k/uL Final    Basophils % 09/12/2021 0.9  0.0 - 2.5 % Final    RBC 09/12/2021 5.53  4.00 - 5.58 M/uL Final    Hemoglobin 09/12/2021 16.0  12.5 - 16.1 g/dL Final    Hematocrit 16/12/9602 51.8 (H)  37.2 - 47.8 % Final    MCV 09/12/2021 94  81 - 97 fL Final    MCH 09/12/2021 29  27 - 33 pg Final    MCHC 09/12/2021 30.8 (L)  32.5 - 34.8 g/dL Final    RDW 54/11/8117 12.4  11.6 - 14.8 % Final    Platelets 09/12/2021 189  142 - 424 K/uL Final    MPV 09/12/2021 6.48 (L)  7.00 - 10.00 fL Final   Nurse Only on 08/16/2021   Component Date Value Ref Range Status    Sodium 08/16/2021 142  135 - 145 mmol/L Final    Potassium 08/16/2021 4.4  3.5 - 5.3 mmol/L Final    Chloride 08/16/2021 102  98 - 107 mmol/L Final    CO2 08/16/2021 33 (H)  22 - 29 mmol/L Final    Glucose 08/16/2021 74  70 - 99 mg/dl Final    BUN 14/78/2956 17  6 - 20 mg/dl Final    Creatinine 21/30/8657 1.4 (H)  0.7 - 1.3 mg/dl Final    Calcium 84/69/6295 8.9  8.4 - 10.2 mg/dl Final    ALT 28/41/3244 24  0 - 50 U/L Final    AST 08/16/2021 25  0 - 50 U/L Final    Alk Phosphatase 08/16/2021 51  40 - 130 U/L Final    Total Bilirubin 08/16/2021 0.6  0.0 - 1.2 mg/dl Final    Total Protein 08/16/2021 7.1  6.4 - 8.3 g/dl Final    Albumin 04/03/7251 4.3  3.5 - 5.2 g/dl Final    WBC 66/44/0347 3.8  3.2 - 12.0 K/uL Final    Neutrophils Absolute 08/16/2021 2.5  2.0 - 6.9 K/uL Final    Neutrophils % 08/16/2021 65.7  37.0 - 80.0 % Final    Absolute Lymph # 08/16/2021 0.9  0.6 - 3.4 K/uL Final    Lymphocytes 08/16/2021 24.5  10.0 - 50.0 % Final    Monocytes Absolute 08/16/2021 0.2  0.0 - 0.9 K/uL Final    Monocytes 08/16/2021 6.0  0.0 - 12.0 % Final    Absolute Eos # 08/16/2021 0.1  0.0 - 0.7 K/uL Final    Eosinophils % 08/16/2021 3.0  0.0 - 7.0 % Final    Absolute Baso # 08/16/2021 0.0  0.0 - 0.2 k/uL Final    Basophils % 08/16/2021 0.9  0.0 - 2.5 % Final    RBC  08/16/2021 5.75 (H)  4.00 - 5.58 M/uL Final    Hemoglobin 08/16/2021 16.6 (H)  12.5 - 16.1 g/dL Final    Hematocrit 42/59/5638 54.5 (H)  37.2 - 47.8 % Final    MCV 08/16/2021 95  81 - 97 fL Final    MCH 08/16/2021 29  27 - 33 pg Final    MCHC 08/16/2021 30.4 (L)  32.5 - 34.8 g/dL Final    RDW 75/64/3329 12.6  11.6 - 14.8 % Final    Platelets 08/16/2021 158  142 - 424 K/uL Final    MPV 08/16/2021 6.66 (  L)  7.00 - 10.00 fL Final   Nurse Only on 05/30/2021   Component Date Value Ref Range Status    Sodium 05/30/2021 140  135 - 145 mmol/L Final    Potassium 05/30/2021 4.6  3.5 - 5.3 mmol/L Final    Chloride 05/30/2021 101  98 - 107 mmol/L Final    CO2 05/30/2021 34 (H)  22 - 29 mmol/L Final    Glucose 05/30/2021 95  70 - 99 mg/dl Final    BUN 91/47/8295 31 (H)  6 - 20 mg/dl Final    Creatinine 62/13/0865 1.5 (H)  0.7 - 1.3 mg/dl Final    Calcium 78/46/9629 9.2  8.4 - 10.2 mg/dl Final    ALT 52/84/1324 24  0 - 50 U/L Final    AST 05/30/2021 23  0 - 50 U/L Final    Alk Phosphatase 05/30/2021 53  40 - 130 U/L Final    Total Bilirubin 05/30/2021 0.4  0.0 - 1.2 mg/dl Final    Total Protein 05/30/2021 7.1  6.4 - 8.3 g/dl Final    Albumin 40/12/2723 4.4  3.5 - 5.2 g/dl Final    WBC 36/64/4034 4.6  3.2 - 12.0 K/uL Final    Neutrophils Absolute 05/30/2021 3.1  2.0 - 6.9 K/uL Final    Neutrophils % 05/30/2021 67.3  37.0 - 80.0 % Final    Absolute Lymph # 05/30/2021 1.0  0.6 - 3.4 K/uL Final    Lymphocytes 05/30/2021 22.0  10.0 - 50.0 % Final    Monocytes Absolute 05/30/2021 0.3  0.0 - 0.9 K/uL Final    Monocytes 05/30/2021 7.0  0.0 - 12.0 % Final    Absolute Eos # 05/30/2021 0.1  0.0 - 0.7 K/uL Final    Eosinophils % 05/30/2021 2.9  0.0 - 7.0 % Final    Absolute Baso # 05/30/2021 0.0  0.0 - 0.2 k/uL Final    Basophils % 05/30/2021 0.9  0.0 - 2.5 % Final    RBC 05/30/2021 5.85 (H)  4.00 - 5.58 M/uL Final    Hemoglobin 05/30/2021 16.7 (H)  12.5 - 16.1 g/dL Final    Hematocrit 74/25/9563 55.9 (H)  37.2 - 47.8 % Final    MCV  05/30/2021 95  81 - 97 fL Final    MCH 05/30/2021 29  27 - 33 pg Final    MCHC 05/30/2021 29.9 (L)  32.5 - 34.8 g/dL Final    RDW 87/56/4332 13.7  11.6 - 14.8 % Final    Platelets 05/30/2021 185  142 - 424 K/uL Final    MPV 05/30/2021 7.16  7.00 - 10.00 fL Final   There may be more visits with results that are not included.        ASSESSMENT/PLAN:  1. Testicular hypofunction  2. Alopecia  3. Hypogonadism in male        Patient has been on testosterone therapy for more than 30 years.  He needs the testosterone treatment and the current method of administration is the best and most physiological.    He now feels well in general but I will check to make sure the testosterone dosage is adequate for him and if it is then we will have the patient follow-up as usual in 6 months.    No follow-ups on file.           An electronic signature was used to authenticate this note.    --Oneita Hurt, MA

## 2022-05-17 NOTE — Telephone Encounter (Signed)
Prior Auth approved I spoke with patient and advised. Patient will follow up with pharmacy.

## 2022-05-17 NOTE — Telephone Encounter (Signed)
Prior Auth initiated

## 2022-06-21 ENCOUNTER — Inpatient Hospital Stay: Admit: 2022-06-21 | Discharge: 2022-06-21 | Payer: BLUE CROSS/BLUE SHIELD | Primary: Hematology & Oncology

## 2022-06-21 DIAGNOSIS — D751 Secondary polycythemia: Secondary | ICD-10-CM

## 2022-06-21 NOTE — Plan of Care (Signed)
THERAPEUTIC PHLEBOTOMY    Recent Lab Results    Lab Results   Component Value Date    WBC 5.4 04/26/2022    HGB 15.1 04/26/2022    HCT 45.0 04/26/2022    MCV 92 04/26/2022    PLT 179 04/26/2022     Lab Results   Component Value Date    FERRITIN 17 (L) 12/26/2021       Pre-phlebotomy Vital Signs     Vitals:    06/21/22 1421   BP: (!) 144/97   Pulse: 74   Resp: 16         Tourniquet placed and arm assessed. Area of venous access cleansed with alcohol. PIV inserted into the right   antecubital site. Blood flowing well into collection bag.     Needle removed from patient's arm.  Pressure applied to patient's arm until bleeding stopped. Dry dressing applied over puncture site and secured with Coban/self-adherent wrap.    Final amount of blood removed: 500  Post Vital Signs: Refer to Doc flow sheet  Post-Hydration (If applicable):    Patient tolerated procedure well.    Discharge Instructions provided to increase fluid intake, eat regularly and don't skip any meals, change positions slowly, no strenuous activity and keep dressing intact for 2 hours.

## 2022-07-10 ENCOUNTER — Ambulatory Visit
Admit: 2022-07-10 | Discharge: 2022-07-10 | Payer: BLUE CROSS/BLUE SHIELD | Attending: Hematology & Oncology | Primary: Hematology & Oncology

## 2022-07-10 ENCOUNTER — Encounter: Primary: Hematology & Oncology

## 2022-07-10 ENCOUNTER — Ambulatory Visit: Admit: 2022-07-10 | Discharge: 2022-07-10 | Payer: BLUE CROSS/BLUE SHIELD | Primary: Hematology & Oncology

## 2022-07-10 ENCOUNTER — Encounter: Attending: Hematology & Oncology | Primary: Hematology & Oncology

## 2022-07-10 ENCOUNTER — Inpatient Hospital Stay: Admit: 2022-07-10 | Discharge: 2022-07-10 | Payer: BLUE CROSS/BLUE SHIELD | Primary: Hematology & Oncology

## 2022-07-10 ENCOUNTER — Encounter

## 2022-07-10 DIAGNOSIS — D751 Secondary polycythemia: Secondary | ICD-10-CM

## 2022-07-10 LAB — CBC WITH AUTO DIFFERENTIAL
Absolute Baso #: 0 10*3/uL (ref 0.0–0.1)
Absolute Eos #: 0.2 10*3/uL (ref 0.0–0.6)
Absolute Lymph #: 0.9 10*3/uL (ref 0.5–5.0)
Basophils %: 0.7 % (ref 0.0–1.2)
Eosinophils %: 3.3 % (ref 0.0–5.4)
Hematocrit: 51 % — ABNORMAL HIGH (ref 37.2–47.8)
Hemoglobin: 16.4 g/dL — ABNORMAL HIGH (ref 12.5–16.1)
Lymphocytes: 20.3 % (ref 10.0–50.0)
MCH: 31 pg (ref 27–33)
MCHC: 32.2 g/dL — ABNORMAL LOW (ref 32.5–34.8)
MCV: 95 fL (ref 81–97)
MPV: 6.73 fL — ABNORMAL LOW (ref 7.00–10.00)
Monocytes Absolute: 0.3 10*3/uL (ref 0.1–1.3)
Monocytes: 7 % (ref 4.0–12.7)
Neutrophils %: 68.8 % (ref 40.1–76.4)
Neutrophils Absolute: 3.2 10*3/uL (ref 1.3–8.4)
Platelets: 166 10*3/uL (ref 142–424)
RBC: 5.38 M/uL (ref 4.00–5.58)
RDW: 13.5 % (ref 11.6–14.8)
WBC: 4.6 10*3/uL (ref 3.2–12.0)

## 2022-07-10 LAB — COMPREHENSIVE METABOLIC PANEL
ALT: 29 U/L (ref 0–50)
AST: 31 U/L (ref 0–50)
Albumin: 4.3 g/dl (ref 3.5–5.2)
Alk Phosphatase: 52 U/L (ref 40–130)
BUN: 22 mg/dl — ABNORMAL HIGH (ref 6–20)
CO2: 30 mmol/L — ABNORMAL HIGH (ref 22–29)
Calcium: 9.3 mg/dl (ref 8.4–10.2)
Chloride: 103 mmol/L (ref 98–107)
Creatinine: 1.3 mg/dl (ref 0.7–1.3)
Glucose: 85 mg/dl (ref 70–99)
Potassium: 4.7 mmol/L (ref 3.5–5.3)
Sodium: 140 mmol/L (ref 135–145)
Total Bilirubin: 0.6 mg/dl (ref 0.0–1.2)
Total Protein: 7.3 g/dl (ref 6.4–8.3)

## 2022-07-10 MED ORDER — SODIUM CHLORIDE 0.9 % IV BOLUS
0.9 % | Freq: Once | INTRAVENOUS | Status: AC
Start: 2022-07-10 — End: 2022-07-12

## 2022-07-10 NOTE — Plan of Care (Signed)
THERAPEUTIC PHLEBOTOMY    Recent Lab Results    Lab Results   Component Value Date    WBC 4.6 07/10/2022    HGB 16.4 (H) 07/10/2022    HCT 51.0 (H) 07/10/2022    MCV 95 07/10/2022    PLT 166 07/10/2022     Lab Results   Component Value Date    FERRITIN 17 (L) 12/26/2021       Pre-phlebotomy Vital Signs     There were no vitals filed for this visit.      Tourniquet placed and arm assessed. Area of venous access cleansed with alcohol. PIV inserted into the right antecubital site. Blood flowing well into collection bag.     Needle removed from patient's arm.  Pressure applied to patient's arm until bleeding stopped. Dry dressing applied over puncture site and secured with Coban/self-adherent wrap.    Final amount of blood removed: 500  Post Vital Signs: Refer to Doc flow sheet  Post-Hydration (If applicable):    Patient tolerated procedure well.    Discharge Instructions provided to increase fluid intake, eat regularly and don't skip any meals, change positions slowly, no strenuous activity and keep dressing intact for 2 hours.

## 2022-07-10 NOTE — Progress Notes (Signed)
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  He reports he has been feeling relatively well but has days where he states he just does not feel right.  He continues on testosterone.  He reports he has had a change in a testosterone product and is using a testosterone gel with modifications to the upper arms daily.  He has had no headache.  He denies any swelling or suspicion of thrombosis.  He takes Slow Fe twice a week due to his iron deficiency from recurrent phlebotomy.  His hematocrit today is 51 with a goal hematocrit of 48%.  He reports he has concerns about kidney function and has seen a nephrologist on 1 occasion previously.        PHYSICAL EXAM  afebrile  Lungs: clear to auscultation  Cardiac: regular rate and rhythm  Abdomen: soft, non-tender, without mass  Extremities: without edema  Neuro: alert and conversant  With 4 applications to the upper arms daily.  He         PAST MEDICAL HISTORY:  Past Medical History:   Diagnosis Date    Hypogonadism in male        PAST SURGICAL HISTORY:  No past surgical history on file.    HOME MEDICATIONS:  Current Outpatient Medications   Medication Sig Dispense Refill    Testosterone 30 MG/ACT SOLN Apply 4 applications transdermally daily, equivalent to a daily dose of 120 mg testosterone.  Dispense 90-day supply and 1 refill. 90 each 1    folic acid (FOLVITE) 1 MG tablet 1 tablet Orally Once a day for 30 day(s) (Patient not taking: Reported on 05/16/2022) 90 tablet 3     No current facility-administered medications for this visit.     Facility-Administered Medications Ordered in Other Visits   Medication Dose Route Frequency Provider Last Rate Last Admin    sodium chloride 0.9 % bolus 250 mL  250 mL IntraVENous Once Dorothyann Peng,  MD            ALLERGIES:  Gadolinium, Iodides, and Iodinated contrast media    SOCIAL HISTORY:  Social History     Socioeconomic History    Marital status: Married   Tobacco Use    Smoking status: Never    Smokeless tobacco: Never   Vaping Use    Vaping Use: Never used   Substance and Sexual Activity    Alcohol use: Never    Drug use: Never        REVIEW OF SYSTEMS:  Review of Systems   Constitutional:  Negative for activity change, appetite change, chills, fatigue, fever and unexpected weight change.   HENT:  Negative for sinus pain and sore throat.    Respiratory:  Negative for cough, chest tightness, shortness of breath and wheezing.    Cardiovascular:  Negative for chest pain and palpitations.   Gastrointestinal:  Negative for abdominal distention, abdominal pain, blood in stool, constipation, diarrhea, nausea and vomiting.   Genitourinary:  Negative for difficulty urinating, dysuria, hematuria and urgency.   Musculoskeletal:  Negative for arthralgias, joint swelling, myalgias and neck stiffness.   Skin:  Negative for rash.   Neurological:  Negative for dizziness,  syncope, weakness, light-headedness, numbness and headaches.   Hematological:  Negative for adenopathy. Does not bruise/bleed easily.   Psychiatric/Behavioral:  Negative for confusion and hallucinations.             VITAL SIGNS:  BP 121/76 (Site: Right Upper Arm, Position: Sitting, Cuff Size: Large Adult)   Pulse 69   Temp 98.2 F (36.8 C)   Wt 104.3 kg (230 lb)   SpO2 96%   BMI 31.19 kg/m     PHYSICAL EXAM:    Constitutional:  Normal appearance; no acute distress  Eyes:  Conjunctiva normal; eyelids normal; sclera anicteric  Ear, Nose, Throat:  External ears and nose normal; hearing grossly normal; oropharynx unremarkable  Neck:  Supple; no masses; no adenopathy  Respiratory:  Breathing comfortably; lungs clear to auscultation and percussion  Cardiovascular:  Normal heart sounds; regular rate and rhythm; no peripheral edema  Abdomen:  No masses;  no tenderness; no hepatomegaly; no splenomegaly; bowel sounds normal  Lymph nodes:  No cervical, axillary, supraclavicular or inguinal adenopathy  Skin:  No rash; no petechiae; no ecchymoses; no pallor; no cutaneous nodules  Musculoskeletal:  Normal muscle strength  Neurologic:  No focal motor or sensory deficit; normal gait; no abnormal mental status  Psychiatric:  Oriented to person time and place; mood and affect appropriate to situation; appropriate judgment and insight; memory intact    LABORATORY DATA:     CBC:  Recent Labs     07/10/22  1356   WBC 4.6   RBC 5.38   HGB 16.4*   HCT 51.0*   MCV 95   RDW 13.5   PLT 166     CHEMISTRIES:  Recent Labs     07/10/22  1356   NA 140   K 4.7   CL 103   CO2 30*   BUN 22*   CREATININE 1.3   GLUCOSE 85     PT/INR:No results for input(s): "PROTIME", "INR" in the last 72 hours.  APTT:No results for input(s): "APTT" in the last 72 hours.  LIVER PROFILE:  Recent Labs     07/10/22  1356   AST 31   ALT 29   BILITOT 0.6   ALKPHOS 52             RADIOLOGY RESULTS:     No results found.      Impression & Plan:      1. 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  He will have a phlebotomy today with a goal hematocrit of 40%.  He had multiple questions regarding his kidney function and effects of diet.  This was discussed in detail and he is reassured that his creatinine is only 1.3 and that he has no impending kidney failure.  He we will try to follow a better diet with a high-protein low-fat diet.  We discussed supplements such as creatine which I do not feel provides any benefit to him.  He is instructed to have his CBC checked in 3 weeks in [facility].  I will see him in follow-up in 3 months.

## 2022-08-06 ENCOUNTER — Ambulatory Visit: Payer: BLUE CROSS/BLUE SHIELD | Primary: Hematology & Oncology

## 2022-08-06 DIAGNOSIS — D751 Secondary polycythemia: Secondary | ICD-10-CM

## 2022-08-06 NOTE — Plan of Care (Signed)
THERAPEUTIC PHLEBOTOMY    Recent Lab Results    Lab Results   Component Value Date    WBC 4.6 07/10/2022    HGB 16.4 (H) 07/10/2022    HCT 51.0 (H) 07/10/2022    MCV 95 07/10/2022    PLT 166 07/10/2022     Lab Results   Component Value Date    FERRITIN 17 (L) 12/26/2021       Pre-phlebotomy Vital Signs     Vitals:    08/06/22 1304   BP: 131/83   Pulse: 62         Tourniquet placed and arm assessed. Area of venous access cleansed with alcohol. PIV inserted into the right antecubital site. Blood flowing well into collection bag.     Needle removed from patient's arm.  Pressure applied to patient's arm until bleeding stopped. Dry dressing applied over puncture site and secured with Coban/self-adherent wrap.    Final amount of blood removed: 500  Post Vital Signs: Refer to Doc flow sheet  Post-Hydration (If applicable):    Patient tolerated procedure well.    Discharge Instructions provided to increase fluid intake, eat regularly and don't skip any meals, change positions slowly, no strenuous activity and keep dressing intact for 2 hours.

## 2022-08-15 ENCOUNTER — Encounter
Admit: 2022-08-15 | Discharge: 2022-08-15 | Payer: BLUE CROSS/BLUE SHIELD | Attending: "Endocrinology | Primary: Hematology & Oncology

## 2022-08-15 DIAGNOSIS — E291 Testicular hypofunction: Secondary | ICD-10-CM

## 2022-08-15 NOTE — Progress Notes (Unsigned)
[facility]  Tel: [phone]  Fax: [phone]       Endocrinology Progress Note           No chief complaint on file.      HPI:    Kevin Richards (DOB:  [birth year]) is a 57 y.o. 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 At one point, the patient was hospitalized and was given ATG. The patient has not  experienced many symptoms of PNH through the years, but has required treatment with testosterone for hypogonadism. He  moved to [place] around 2015. I increased the dose of his testosterone to 5 packets per day and since then he has felt much  better. He was able to exercise and lose significant amount of weight. Patient reduced his dose of testosterone from 5 packets to 4 packets a day equivalent to 100 mg/day since the last visit because of having polycythemia and having to get multiple phlebotomy visits.  He had labs prior to the appointment and wants to discuss the results but believes the increase in serum testosterone that was detected to be caused by contamination, as the lab was drawn from the same arm where he applied the testosterone gel and was done 1 hour after application.    05/16/22   This is a virtual visit with both, video and audio components, and patient consent was obtained.  Since the last visit, the patient has been taking the medications as instructed, had labs drawn prior to this visit and wants to discuss the results, and he had phlebotomy in January 2024.   He started feeling very tired and realized that it was because of his testosterone level 312.  He contacted me and increase the dose of testosterone packets to 5 packets a day equivalent to 125 mg testosterone transdermally daily, but his insurance changed and now he is  supposed to get the testosterone pump rather than the gel and the pharmacist told him that it is a pump that delivers 30 mg per application available and he would need for applications.    Past Medical History:   Diagnosis Date    Hypogonadism in male         No past surgical history on file.     Outpatient Medications Prior to Visit   Medication Sig Dispense Refill    Testosterone 30 MG/ACT SOLN Apply 4 applications transdermally daily, equivalent to a daily dose of 120 mg testosterone.  Dispense 90-day supply and 1 refill. 90 each 1    folic acid (FOLVITE) 1 MG tablet 1 tablet Orally Once a day for 30 day(s) (Patient not taking: Reported on 05/16/2022) 90 tablet 3     No facility-administered medications prior to visit.        Allergies   Allergen Reactions    Gadolinium Nausea Only    Iodides      Other reaction(s): severe fever    Iodinated Contrast Media Nausea Only         reports that he has never smoked. He has never used smokeless tobacco. He reports that he does not drink alcohol and does not use drugs.     No family history on file.        ROS:  Other  than what was mentioned in HPI, a thorough 11 point Review of systems was done and was negative.    There were no vitals filed for this visit.       There is no height or weight on file to calculate BMI.     PE:    General: Well-nourished, in no obvious distress  HEENT: PERRLA, EOMI  Neck: Supple, no JVD  Thyroid: no visible goiter  Abdomen: Relaxed, nondistended  Extremities: No cyanosis, clubbing or edema  Neurological: No lateralization      Nurse Only on 07/10/2022   Component Date Value Ref Range Status    WBC 07/10/2022 4.6  3.2 - 12.0 K/uL Final    Neutrophils Absolute 07/10/2022 3.2  1.3 - 8.4 K/uL Final    Neutrophils % 07/10/2022 68.8  40.1 - 76.4 % Final    Lymphocytes Absolute 07/10/2022 0.9  0.5 - 5.0 K/uL Final    Lymphocytes 07/10/2022 20.3  10.0 - 50.0 % Final    Monocytes Absolute 07/10/2022 0.3  0.1 - 1.3 K/uL Final    Monocytes % 07/10/2022  7.0  4.0 - 12.7 % Final    Eosinophils Absolute 07/10/2022 0.2  0.0 - 0.6 K/uL Final    Eosinophils % 07/10/2022 3.3  0.0 - 5.4 % Final    Basophils Absolute 07/10/2022 0.0  0.0 - 0.1 k/uL Final    Basophils % 07/10/2022 0.7  0.0 - 1.2 % Final    RBC 07/10/2022 5.38  4.00 - 5.58 M/uL Final    Hemoglobin 07/10/2022 16.4 (H)  12.5 - 16.1 g/dL Final    Hematocrit 16/12/9602 51.0 (H)  37.2 - 47.8 % Final    MCV 07/10/2022 95  81 - 97 fL Final    MCH 07/10/2022 31  27 - 33 pg Final    MCHC 07/10/2022 32.2 (L)  32.5 - 34.8 g/dL Final    RDW 54/11/8117 13.5  11.6 - 14.8 % Final    Platelets 07/10/2022 166  142 - 424 K/uL Final    MPV 07/10/2022 6.73 (L)  7.00 - 10.00 fL Final    Sodium 07/10/2022 140  135 - 145 mmol/L Final    Potassium 07/10/2022 4.7  3.5 - 5.3 mmol/L Final    Chloride 07/10/2022 103  98 - 107 mmol/L Final    CO2 07/10/2022 30 (H)  22 - 29 mmol/L Final    Glucose 07/10/2022 85  70 - 99 mg/dl Final    BUN 14/78/2956 22 (H)  6 - 20 mg/dl Final    Creatinine 21/30/8657 1.3  0.7 - 1.3 mg/dl Final    Calcium 84/69/6295 9.3  8.4 - 10.2 mg/dl Final    ALT 28/41/3244 29  0 - 50 U/L Final    AST 07/10/2022 31  0 - 50 U/L Final    Alk Phosphatase 07/10/2022 52  40 - 130 U/L Final    Total Bilirubin 07/10/2022 0.6  0.0 - 1.2 mg/dl Final    Total Protein 07/10/2022 7.3  6.4 - 8.3 g/dl Final    Albumin 04/03/7251 4.3  3.5 - 5.2 g/dl Final   Nurse Only on 66/44/0347   Component Date Value Ref Range Status    WBC 04/10/2022 4.6  3.2 - 12.0 K/uL Final    Neutrophils Absolute 04/10/2022 3.3  2.0 - 6.9 K/uL Final    Neutrophils % 04/10/2022 72.0  37.0 - 80.0 % Final    Lymphocytes Absolute 04/10/2022 0.8  0.6 - 3.4 K/uL Final  Lymphocytes 04/10/2022 18.1  10.0 - 50.0 % Final    Monocytes Absolute 04/10/2022 0.3  0.0 - 0.9 K/uL Final    Monocytes % 04/10/2022 6.7  0.0 - 12.0 % Final    Eosinophils Absolute 04/10/2022 0.1  0.0 - 0.7 K/uL Final    Eosinophils % 04/10/2022 2.5  0.0 - 7.0 % Final    Basophils Absolute  04/10/2022 0.0  0.0 - 0.2 k/uL Final    Basophils % 04/10/2022 0.7  0.0 - 2.5 % Final    RBC 04/10/2022 6.07 (H)  4.00 - 5.58 M/uL Final    Hemoglobin 04/10/2022 17.1 (H)  12.5 - 16.1 g/dL Final    Hematocrit 16/12/9602 56.1 (H)  37.2 - 47.8 % Final    MCV 04/10/2022 92  81 - 97 fL Final    MCH 04/10/2022 28  27 - 33 pg Final    MCHC 04/10/2022 30.5 (L)  32.5 - 34.8 g/dL Final    RDW 54/11/8117 13.0  11.6 - 14.8 % Final    Platelets 04/10/2022 230  142 - 424 K/uL Final    MPV 04/10/2022 5.32 (L)  7.00 - 10.00 fL Final   Telephone on 04/09/2022   Component Date Value Ref Range Status    WBC 04/26/2022 5.4  3.4 - 10.8 x10E3/uL Final    RBC 04/26/2022 4.87  4.14 - 5.80 x10E6/uL Final    Hemoglobin 04/26/2022 15.1  13.0 - 17.7 g/dL Final    Hematocrit 14/78/2956 45.0  37.5 - 51.0 % Final    MCV 04/26/2022 92  79 - 97 fL Final    MCH 04/26/2022 31.0  26.6 - 33.0 pg Final    MCHC 04/26/2022 33.6  31.5 - 35.7 g/dL Final    RDW 21/30/8657 13.9  11.6 - 15.4 % Final    Platelets 04/26/2022 179  150 - 450 x10E3/uL Final    Neutrophils % 04/26/2022 73  Not Estab. % Final    Lymphocytes % 04/26/2022 19  Not Estab. % Final    Monocytes % 04/26/2022 6  Not Estab. % Final    Eosinophils % 04/26/2022 2  Not Estab. % Final    Basophils % 04/26/2022 0  Not Estab. % Final    Neutrophils Absolute 04/26/2022 3.9  1.4 - 7.0 x10E3/uL Final    Lymphocytes Absolute 04/26/2022 1.0  0.7 - 3.1 x10E3/uL Final    Monocytes Absolute 04/26/2022 0.3  0.1 - 0.9 x10E3/uL Final    Eosinophils Absolute 04/26/2022 0.1  0.0 - 0.4 x10E3/uL Final    Basophils Absolute 04/26/2022 0.0  0.0 - 0.2 x10E3/uL Final    Immature Granulocytes % 04/26/2022 0  Not Estab. % Final    Immature Grans (Abs) 04/26/2022 0.0  0.0 - 0.1 x10E3/uL Final    Comment: A hand-written panel/profile was received from your office. In  accordance with the [facility] Ambiguous Test Code Policy dated July  2003, we have assigned CBC with Differential/Platelet, Test Code  #005009 to this  request. If this is not the testing you wished to  receive on this specimen, please contact the [facility] Department to clarify the test order. We  appreciate your business.      Estradiol 04/26/2022 22.5  7.6 - 42.6 pg/mL Final    Roche ECLIA methodology    PSA 04/26/2022 2.4  0.0 - 4.0 ng/mL Final    Comment: Roche ECLIA methodology.  According to the American U[employer] Association, Serum PSA should  decrease and  remain at undetectable levels after radical  prostatectomy. The AUA defines biochemical recurrence as an initial  PSA value 0.2 ng/mL or greater followed by a subsequent confirmatory  PSA value 0.2 ng/mL or greater.  Values obtained with different assay methods or kits cannot be used  interchangeably. Results cannot be interpreted as absolute evidence  of the presence or absence of malignant disease.      Testosterone 04/26/2022 312  264 - 916 ng/dL Final    Comment: Adult male reference interval is based on a population of  healthy nonobese males (BMI <30) between 83 and 57 years old.  Travison, et.al. JCEM [phone]. PMID: [id number].      Testosterone, Free 04/26/2022 6.33  5.00 - 21.00 ng/dL Final    Testosterone % Free 04/26/2022 2.03  1.50 - 4.20 % Final   Nurse Only on 12/26/2021   Component Date Value Ref Range Status    Ferritin 12/26/2021 17 (L)  22 - 415 ng/mL Final    Sodium 12/26/2021 140  135 - 145 mmol/L Final    Potassium 12/26/2021 4.4  3.5 - 5.3 mmol/L Final    Chloride 12/26/2021 102  98 - 107 mmol/L Final    CO2 12/26/2021 31 (H)  22 - 29 mmol/L Final    Glucose 12/26/2021 94  70 - 99 mg/dl Final    BUN 62/13/0865 25 (H)  6 - 20 mg/dl Final    Creatinine 78/46/9629 1.3  0.7 - 1.3 mg/dl Final    Calcium 52/84/1324 9.2  8.4 - 10.2 mg/dl Final    ALT 40/12/2723 24  0 - 50 U/L Final    AST 12/26/2021 26  0 - 50 U/L Final    Alk Phosphatase 12/26/2021 55  40 - 130 U/L Final    Total Bilirubin 12/26/2021 0.6  0.0 - 1.2 mg/dl Final    Total Protein  12/26/2021 7.8  6.4 - 8.3 g/dl Final    Albumin 36/64/4034 4.5  3.5 - 5.2 g/dl Final    WBC 74/25/9563 3.3  3.2 - 12.0 K/uL Final    Neutrophils Absolute 12/26/2021 2.0  2.0 - 6.9 K/uL Final    Neutrophils % 12/26/2021 62.0  37.0 - 80.0 % Final    Lymphocytes Absolute 12/26/2021 0.9  0.6 - 3.4 K/uL Final    Lymphocytes 12/26/2021 27.2  10.0 - 50.0 % Final    Monocytes Absolute 12/26/2021 0.2  0.0 - 0.9 K/uL Final    Monocytes % 12/26/2021 7.1  0.0 - 12.0 % Final    Eosinophils Absolute 12/26/2021 0.1  0.0 - 0.7 K/uL Final    Eosinophils % 12/26/2021 2.6  0.0 - 7.0 % Final    Basophils Absolute 12/26/2021 0.0  0.0 - 0.2 k/uL Final    Basophils % 12/26/2021 1.0  0.0 - 2.5 % Final    RBC 12/26/2021 6.17 (H)  4.00 - 5.58 M/uL Final    Hemoglobin 12/26/2021 17.1 (H)  12.5 - 16.1 g/dL Final    Hematocrit 87/56/4332 56.9 (H)  37.2 - 47.8 % Final    MCV 12/26/2021 92  81 - 97 fL Final    MCH 12/26/2021 28  27 - 33 pg Final    MCHC 12/26/2021 30.1 (L)  32.5 - 34.8 g/dL Final    RDW 95/18/8416 13.3  11.6 - 14.8 % Final    Platelets 12/26/2021 157  142 - 424 K/uL Final    MPV 12/26/2021 6.79 (L)  7.00 - 10.00 fL Final   Orders Only  on 12/26/2021   Component Date Value Ref Range Status    Testosterone 12/26/2021 570.0  193.0 - 740.0 ng/dL Final    WBC 16/12/9602 3.0 (L)  3.8 - 10.6 x10e3/mcL Final    RBC 12/26/2021 5.78 (H)  4.00 - 5.60 x10e6/mcL Final    Hemoglobin 12/26/2021 17.6 (H)  13.0 - 17.3 g/dL Final    Hematocrit 54/11/8117 52.0  38.0 - 52.0 % Final    MCV 12/26/2021 90.0  84.0 - 100.0 fL Final    MCH 12/26/2021 30.4  27.0 - 34.5 pg Final    MCHC 12/26/2021 33.8  32.0 - 36.0 g/dL Final    RDW 14/78/2956 13.6  11.0 - 16.0 % Final    Platelets 12/26/2021 154  140 - 440 x10e3/mcL Final    MPV 12/26/2021 10.2  7.2 - 13.2 fL Final    NRBC Automated 12/26/2021 0.0  0.0 - 0.2 % Final    NRBC Absolute 12/26/2021 0.000  0.000 - 0.012 x10e3/mcL Final    Neutrophils % 12/26/2021 61.2  42.0 - 74.0 % Final    Lymphocytes  12/26/2021 27.8  15.0 - 45.0 % Final    Monocytes % 12/26/2021 7.0  4.0 - 12.0 % Final    Eosinophils % 12/26/2021 3.0  0.0 - 7.0 % Final    Basophils % 12/26/2021 0.7  0.0 - 2.0 % Final    Neutrophils Absolute 12/26/2021 1.8  1.6 - 7.3 x10e3/mcL Final    Lymphocytes Absolute 12/26/2021 0.8 (L)  1.0 - 3.2 x10e3/mcL Final    Monocytes Absolute 12/26/2021 0.2 (L)  0.3 - 1.0 x10e3/mcL Final    Eosinophils Absolute 12/26/2021 0.1  0.0 - 0.5 x10e3/mcL Final    Basophils Absolute 12/26/2021 0.0  0.0 - 0.2 x10e3/mcL Final    Immature Granulocytes % 12/26/2021 0.3  0.0 - 0.6 % Final    Immature Grans (Abs) 12/26/2021 0.01  0.00 - 0.06 x10e3/mcL Final   Telemedicine on 11/15/2021   Component Date Value Ref Range Status    Testosterone 12/26/2021 483  264 - 916 ng/dL Final    Comment: Adult male reference interval is based on a population of  healthy nonobese males (BMI <30) between 72 and 45 years  old. Travison, et.al. JCEM [phone]. PMID:  [id number].      Testosterone, Free 12/26/2021 11.30  5.00 - 21.00 ng/dL Final    Testosterone % Free 12/26/2021 2.34  1.50 - 4.20 % Final    Comment: Performed At: [facility]  [address] [place], [place] [id number]  Jolene Schimke MD GU:[phone]     Telephone on 11/01/2021   Component Date Value Ref Range Status    WBC 11/05/2021 4.6  3.4 - 10.8 x10E3/uL Final    RBC 11/05/2021 5.57  4.14 - 5.80 x10E6/uL Final    Hemoglobin 11/05/2021 16.6  13.0 - 17.7 g/dL Final    Hematocrit 56/38/7564 50.6  37.5 - 51.0 % Final    MCV 11/05/2021 91  79 - 97 fL Final    MCH 11/05/2021 29.8  26.6 - 33.0 pg Final    MCHC 11/05/2021 32.8  31.5 - 35.7 g/dL Final    RDW 33/29/5188 14.0  11.6 - 15.4 % Final    Platelets 11/05/2021 165  150 - 450 x10E3/uL Final    Neutrophils % 11/05/2021 66  Not Estab. % Final    Lymphocytes % 11/05/2021 23  Not Estab. % Final    Monocytes % 11/05/2021 8  Not Estab. % Final  Eosinophils % 11/05/2021 2  Not Estab. % Final    Basophils % 11/05/2021 1   Not Estab. % Final    Neutrophils Absolute 11/05/2021 3.1  1.4 - 7.0 x10E3/uL Final    Lymphocytes Absolute 11/05/2021 1.1  0.7 - 3.1 x10E3/uL Final    Monocytes Absolute 11/05/2021 0.4  0.1 - 0.9 x10E3/uL Final    Eosinophils Absolute 11/05/2021 0.1  0.0 - 0.4 x10E3/uL Final    Basophils Absolute 11/05/2021 0.0  0.0 - 0.2 x10E3/uL Final    Immature Granulocytes % 11/05/2021 0  Not Estab. % Final    Immature Grans (Abs) 11/05/2021 0.0  0.0 - 0.1 x10E3/uL Final    Comment: A hand-written panel/profile was received from your office. In  accordance with the [facility] Ambiguous Test Code Policy dated July  2003, we have assigned CBC with Differential/Platelet, Test Code  #005009 to this request. If this is not the testing you wished to  receive on this specimen, please contact the [facility] Department to clarify the test order. We  appreciate your business.      Testosterone 11/05/2021 >1500 (H)  264 - 916 ng/dL Final    Comment: Adult male reference interval is based on a population of  healthy nonobese males (BMI <30) between 57 and 50 years old.  Travison, et.al. JCEM [phone]. PMID: [id number].     Orders Only on 09/12/2021   Component Date Value Ref Range Status    Testosterone 09/12/2021 636.0  193.0 - 740.0 ng/dL Final    Comment:    Test Performed at:  [facility]  [address] [place], [place] [address]       Nurse Only on 09/12/2021   Component Date Value Ref Range Status    Ferritin 09/12/2021 14 (L)  22 - 415 ng/mL Final    Sodium 09/12/2021 139  135 - 145 mmol/L Final    Potassium 09/12/2021 4.8  3.5 - 5.3 mmol/L Final    Chloride 09/12/2021 103  98 - 107 mmol/L Final    CO2 09/12/2021 31 (H)  22 - 29 mmol/L Final    Glucose 09/12/2021 89  70 - 99 mg/dl Final    BUN 86/57/8469 24 (H)  6 - 20 mg/dl Final    Creatinine 62/95/2841 1.4 (H)  0.7 - 1.3 mg/dl Final    Calcium 32/44/0102 9.3  8.4 - 10.2 mg/dl Final    ALT 72/53/6644 22  0 - 50 U/L Final     AST 09/12/2021 23  0 - 50 U/L Final    Alk Phosphatase 09/12/2021 69  40 - 130 U/L Final    Total Bilirubin 09/12/2021 0.6  0.0 - 1.2 mg/dl Final    Total Protein 09/12/2021 7.6  6.4 - 8.3 g/dl Final    Albumin 03/47/4259 4.5  3.5 - 5.2 g/dl Final    WBC 56/38/7564 4.1  3.2 - 12.0 K/uL Final    Neutrophils Absolute 09/12/2021 2.6  2.0 - 6.9 K/uL Final    Neutrophils % 09/12/2021 63.0  37.0 - 80.0 % Final    Lymphocytes Absolute 09/12/2021 1.1  0.6 - 3.4 K/uL Final    Lymphocytes 09/12/2021 26.1  10.0 - 50.0 % Final    Monocytes Absolute 09/12/2021 0.3  0.0 - 0.9 K/uL Final    Monocytes % 09/12/2021 6.9  0.0 - 12.0 % Final    Eosinophils Absolute 09/12/2021 0.1  0.0 - 0.7 K/uL Final    Eosinophils %  09/12/2021 3.1  0.0 - 7.0 % Final    Basophils Absolute 09/12/2021 0.0  0.0 - 0.2 k/uL Final    Basophils % 09/12/2021 0.9  0.0 - 2.5 % Final    RBC 09/12/2021 5.53  4.00 - 5.58 M/uL Final    Hemoglobin 09/12/2021 16.0  12.5 - 16.1 g/dL Final    Hematocrit 40/98/1191 51.8 (H)  37.2 - 47.8 % Final    MCV 09/12/2021 94  81 - 97 fL Final    MCH 09/12/2021 29  27 - 33 pg Final    MCHC 09/12/2021 30.8 (L)  32.5 - 34.8 g/dL Final    RDW 47/82/9562 12.4  11.6 - 14.8 % Final    Platelets 09/12/2021 189  142 - 424 K/uL Final    MPV 09/12/2021 6.48 (L)  7.00 - 10.00 fL Final   Nurse Only on 08/16/2021   Component Date Value Ref Range Status    Sodium 08/16/2021 142  135 - 145 mmol/L Final    Potassium 08/16/2021 4.4  3.5 - 5.3 mmol/L Final    Chloride 08/16/2021 102  98 - 107 mmol/L Final    CO2 08/16/2021 33 (H)  22 - 29 mmol/L Final    Glucose 08/16/2021 74  70 - 99 mg/dl Final    BUN 13/10/6576 17  6 - 20 mg/dl Final    Creatinine 46/96/2952 1.4 (H)  0.7 - 1.3 mg/dl Final    Calcium 84/13/2440 8.9  8.4 - 10.2 mg/dl Final    ALT 01/26/2535 24  0 - 50 U/L Final    AST 08/16/2021 25  0 - 50 U/L Final    Alk Phosphatase 08/16/2021 51  40 - 130 U/L Final    Total Bilirubin 08/16/2021 0.6  0.0 - 1.2 mg/dl Final    Total Protein  08/16/2021 7.1  6.4 - 8.3 g/dl Final    Albumin 64/40/3474 4.3  3.5 - 5.2 g/dl Final    WBC 25/95/6387 3.8  3.2 - 12.0 K/uL Final    Neutrophils Absolute 08/16/2021 2.5  2.0 - 6.9 K/uL Final    Neutrophils % 08/16/2021 65.7  37.0 - 80.0 % Final    Lymphocytes Absolute 08/16/2021 0.9  0.6 - 3.4 K/uL Final    Lymphocytes 08/16/2021 24.5  10.0 - 50.0 % Final    Monocytes Absolute 08/16/2021 0.2  0.0 - 0.9 K/uL Final    Monocytes % 08/16/2021 6.0  0.0 - 12.0 % Final    Eosinophils Absolute 08/16/2021 0.1  0.0 - 0.7 K/uL Final    Eosinophils % 08/16/2021 3.0  0.0 - 7.0 % Final    Basophils Absolute 08/16/2021 0.0  0.0 - 0.2 k/uL Final    Basophils % 08/16/2021 0.9  0.0 - 2.5 % Final    RBC 08/16/2021 5.75 (H)  4.00 - 5.58 M/uL Final    Hemoglobin 08/16/2021 16.6 (H)  12.5 - 16.1 g/dL Final    Hematocrit 56/43/3295 54.5 (H)  37.2 - 47.8 % Final    MCV 08/16/2021 95  81 - 97 fL Final    MCH 08/16/2021 29  27 - 33 pg Final    MCHC 08/16/2021 30.4 (L)  32.5 - 34.8 g/dL Final    RDW 18/84/1660 12.6  11.6 - 14.8 % Final    Platelets 08/16/2021 158  142 - 424 K/uL Final    MPV 08/16/2021 6.66 (L)  7.00 - 10.00 fL Final   There may be more visits with results that are not included.  ASSESSMENT/PLAN:  1. Testicular hypofunction  2. Alopecia  3. Hypogonadism in male  4. Paroxysmal nocturnal hemoglobinuria (PNH) (HCC)        Lab test results were reviewed and discussed with the patient at length.  If the fatigue was caused by low testosterone levels then this should respond to the increase in testosterone dose within 2 weeks of the increased dose.  I cannot rule out other causes of fatigue.  Follow-up with hematology and oncology for management of PNH.    No follow-ups on file.           An electronic signature was used to authenticate this note.    --Oneita Hurt, MA

## 2022-08-22 LAB — CBC/DIFF AMBIGUOUS DEFAULT
Basophils %: 1 %
Basophils Absolute: 0 10*3/uL (ref 0.0–0.2)
Eosinophils %: 2 %
Eosinophils Absolute: 0.1 10*3/uL (ref 0.0–0.4)
Hematocrit: 51.3 % — ABNORMAL HIGH (ref 37.5–51.0)
Hemoglobin: 17.2 g/dL (ref 13.0–17.7)
Immature Grans (Abs): 0 10*3/uL (ref 0.0–0.1)
Immature Granulocytes %: 0 %
Lymphocytes %: 21 %
Lymphocytes Absolute: 0.8 10*3/uL (ref 0.7–3.1)
MCH: 31.1 pg (ref 26.6–33.0)
MCHC: 33.5 g/dL (ref 31.5–35.7)
MCV: 93 fL (ref 79–97)
Monocytes %: 7 %
Monocytes Absolute: 0.3 10*3/uL (ref 0.1–0.9)
Neutrophils %: 69 %
Neutrophils Absolute: 2.8 10*3/uL (ref 1.4–7.0)
Platelets: 176 10*3/uL (ref 150–450)
RBC: 5.53 x10E6/uL (ref 4.14–5.80)
RDW: 14.1 % (ref 11.6–15.4)
WBC: 4 10*3/uL (ref 3.4–10.8)

## 2022-08-22 LAB — IRON AND TIBC
Iron % Saturation: 31 % (ref 15–55)
Iron: 108 ug/dL (ref 38–169)
TIBC: 352 ug/dL (ref 250–450)
UIBC: 244 ug/dL (ref 111–343)

## 2022-08-22 LAB — PSA, DIAGNOSTIC: PSA: 2.6 ng/mL (ref 0.0–4.0)

## 2022-08-22 LAB — ESTRADIOL: Estradiol: 16.6 pg/mL (ref 7.6–42.6)

## 2022-08-22 LAB — FERRITIN: Ferritin: 52 ng/mL (ref 30–400)

## 2022-08-23 ENCOUNTER — Encounter

## 2022-08-23 ENCOUNTER — Inpatient Hospital Stay: Admit: 2022-08-23 | Discharge: 2022-08-23 | Payer: BLUE CROSS/BLUE SHIELD | Primary: Hematology & Oncology

## 2022-08-23 DIAGNOSIS — D751 Secondary polycythemia: Secondary | ICD-10-CM

## 2022-08-23 NOTE — Plan of Care (Signed)
THERAPEUTIC PHLEBOTOMY    Recent Lab Results    Lab Results   Component Value Date    WBC 4.0 08/21/2022    HGB 17.2 08/21/2022    HCT 51.3 (H) 08/21/2022    MCV 93 08/21/2022    PLT 176 08/21/2022     Lab Results   Component Value Date    IRON 108 08/21/2022    TIBC 352 08/21/2022    FERRITIN 52 08/21/2022       Pre-phlebotomy Vital Signs     Vitals:    08/23/22 1442   BP: 122/71   Pulse: 69         Tourniquet placed and arm assessed. Area of venous access cleansed with alcohol. PIV inserted into the right antecubital site. Blood flowing well into collection bag.     Needle removed from patient's arm.  Pressure applied to patient's arm until bleeding stopped. Dry dressing applied over puncture site and secured with Coban/self-adherent wrap.    Final amount of blood removed: 500  Post Vital Signs: Refer to Doc flow sheet  Post-Hydration (If applicable):    Patient tolerated procedure well.    Discharge Instructions provided to increase fluid intake, eat regularly and don't skip any meals, change positions slowly, no strenuous activity and keep dressing intact for 2 hours.

## 2022-08-25 LAB — TESTOSTERONE, FREE/TOTAL EQUILIBRIUM ULTRAFILTRATION
Testosterone % Free: 4.52 % — ABNORMAL HIGH (ref 1.50–4.20)
Testosterone, Free: 15.23 ng/dL (ref 5.00–21.00)
Testosterone: 337 ng/dL (ref 264–916)

## 2022-08-27 NOTE — Telephone Encounter (Signed)
I spoke with patient and he is scheduled.

## 2022-09-04 ENCOUNTER — Telehealth
Admit: 2022-09-04 | Discharge: 2022-09-04 | Payer: BLUE CROSS/BLUE SHIELD | Attending: "Endocrinology | Primary: Hematology & Oncology

## 2022-09-04 DIAGNOSIS — E291 Testicular hypofunction: Principal | ICD-10-CM

## 2022-09-04 MED ORDER — TESTOSTERONE 30 MG/ACT TD SOLN
30 MG/ACT | TRANSDERMAL | 1 refills | Status: DC
Start: 2022-09-04 — End: 2023-03-06

## 2022-09-04 NOTE — Progress Notes (Incomplete)
[facility]  Tel: [phone]  Fax: [phone]       Endocrinology Progress Note           Chief Complaint   Patient presents with    Follow-up     Labs in [place] system  Patient will need a refill for Testosterone would like to see if dosage will be changed first        HPI:    Kevin Richards (DOB:  [birth year]) is a 57 y.o. 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 The patient  was initially diagnosed to have paroxysmal nocturnal hemoglobinuria at the age of 28, and was on various medications  including prednisone and halotestin. At one point, the patient was hospitalized and was given ATG. The patient has not  experienced many symptoms of PNH through the years, but has required treatment with testosterone for hypogonadism. He  moved to [place] around 2015. I increased the dose of his testosterone to 5 packets per day and since then he has felt much  better. He was able to exercise and lose significant amount of weight. Patient reduced his dose of testosterone from 5 packets to 4 packets a day equivalent to 100 mg/day since the last visit because of having polycythemia and having to get multiple phlebotomy visits.  He had labs prior to the appointment and wants to discuss the results but believes the increase in serum testosterone that was detected to be caused by contamination, as the lab was drawn from the same arm where he applied the testosterone gel and was done 1 hour after application.    05/16/22   This is a virtual visit with both, video and audio components, and patient consent was obtained.  Since the last visit, the patient has been taking the medications as instructed, had labs drawn prior to this visit and wants to discuss the results, and he had phlebotomy in January 2024.   He started feeling very tired and realized that it was because of his testosterone level 312.  He contacted me and increase the  dose of testosterone packets to 5 packets a day equivalent to 125 mg testosterone transdermally daily, but his insurance changed and now he is supposed to get the testosterone pump rather than the gel and the pharmacist told him that it is a pump that delivers 30 mg per application available and he would need for applications.    08/15/22   This is a synchronous interactive virtual visit done by video and audio components from provider office to patient home. Patient consent was obtained.   Since the last visit, the patient has been feeling well with no new complaints and has been taking the medications as instructed and had labs drawn prior to this visit and wants to discuss the results He has been complaining of fatigue and was told he had iron deficiency.    09/04/22   This is a synchronous interactive virtual visit done by video and audio components from provider office to patient home. Patient consent was obtained.   Since the last visit, the patient has not been feeling well     Past Medical History:   Diagnosis Date    Hypogonadism in male         History reviewed. No pertinent surgical history.     Outpatient Medications Prior to Visit   Medication Sig Dispense Refill  Testosterone 30 MG/ACT SOLN Apply 4 applications transdermally daily, equivalent to a daily dose of 120 mg testosterone.  Dispense 90-day supply and 1 refill. 90 each 1     No facility-administered medications prior to visit.        Allergies   Allergen Reactions    Gadolinium Nausea Only    Iodides      Other reaction(s): severe fever    Iodinated Contrast Media Nausea Only         reports that he has never smoked. He has never used smokeless tobacco. He reports that he does not drink alcohol and does not use drugs.     History reviewed. No pertinent family history.        ROS:  Other than what was mentioned in HPI, a thorough 11 point Review of systems was done and was negative.    There were no vitals filed for this visit.       There is no  height or weight on file to calculate BMI.     PE:    General: Well-nourished, in no obvious distress  HEENT: PERRLA, EOMI  Neck: Supple, no JVD  Thyroid: no visible goiter  Abdomen: Relaxed, nondistended  Extremities: No cyanosis, clubbing or edema  Neurological: No lateralization    Labs:    Review of male hormone-related Lab test results:    Testosterone, Free Direct ((pg/mL))   Date Value   07/19/2019 21.3   12/02/2017 19.2   10/30/2017 6.4 (L)     Testosterone % Free (%)   Date Value   08/21/2022 4.52 (H)   04/26/2022 2.03   12/26/2021 2.34     Testosterone, Free (ng/dL)   Date Value   13/10/6576 15.23   04/26/2022 6.33   12/26/2021 11.30      Labs:    Review of Lab test results:    WBC   Date Value   08/21/2022 4.0 x10E3/uL   07/10/2022 4.6 K/uL     Hemoglobin (g/dL)   Date Value   46/96/2952 17.2   07/10/2022 16.4 (H)     Hematocrit (%)   Date Value   08/21/2022 51.3 (H)   07/10/2022 51.0 (H)     Platelets   Date Value   08/21/2022 176 x10E3/uL   07/10/2022 166 K/uL     Sodium (mmol/L)   Date Value   07/10/2022 140   12/26/2021 140     Potassium (mmol/L)   Date Value   07/10/2022 4.7   12/26/2021 4.4     Creatinine (mg/dl)   Date Value   84/13/2440 1.3   12/26/2021 1.3     Calcium (mg/dl)   Date Value   01/26/2535 9.3   12/26/2021 9.2     AST (U/L)   Date Value   07/10/2022 31   12/26/2021 26     ALT (U/L)   Date Value   07/10/2022 29   12/26/2021 24     Cholesterol ((mg/dL))   Date Value   64/40/3474 165     HDL ((mg/dL))   Date Value   25/95/6387 50     LDL Calculated ((mg/dL))   Date Value   56/43/3295 96     Triglycerides ((mg/dL))   Date Value   18/84/1660 107     TSH ((uIU/mL))   Date Value   10/30/2017 1.940     TSH, 3rd Generation (mcIU/mL)   Date Value   09/22/2020 2.400   05/24/2020 1.580  ASSESSMENT/PLAN:  1. Testicular hypofunction  -     Testosterone 30 MG/ACT SOLN; Apply 5 actuations transdermally daily, equivalent to a daily dose of 150 mg testosterone.  Each actuation is 1.5 ml and  contains 30 mg testosterone. Dispense 8 bottles, each bottle containing 90 ml, for a 90-day supply and 1 refill., Disp-90 each, R-1Normal  -     CBC with Auto Differential; Future  -     Testosterone, Free/Tot Equilib; Future  -     Estradiol; Future  -     Iron And TIBC; Future  -     Ferritin; Future  2. Alopecia  3. Hypogonadism in male  4. Paroxysmal nocturnal hemoglobinuria (PNH) (HCC)  5. Iron deficiency  -     CBC with Auto Differential; Future  -     Testosterone, Free/Tot Equilib; Future  -     Estradiol; Future  -     Iron And TIBC; Future  -     Ferritin; Future      Lab test results were reviewed and discussed with the patient at length.  Testosterone level was not checked since we changed the formulation.  We need to check testosterone as well as iron and ferritin levels.  I will arrange for another follow-up visit in 4 to 6 weeks to discuss results and evaluate the causes of his fatigue whether hypogonadism, iron deficiency or something else.      No follow-ups on file.         An electronic signature was used to authenticate this note.    --Celso Amy, MD

## 2022-09-10 ENCOUNTER — Encounter

## 2022-09-10 ENCOUNTER — Inpatient Hospital Stay: Admit: 2022-09-10 | Discharge: 2022-09-10 | Payer: BLUE CROSS/BLUE SHIELD | Primary: Hematology & Oncology

## 2022-09-10 DIAGNOSIS — D751 Secondary polycythemia: Secondary | ICD-10-CM

## 2022-10-01 ENCOUNTER — Encounter

## 2022-10-01 ENCOUNTER — Ambulatory Visit: Payer: BLUE CROSS/BLUE SHIELD | Primary: Hematology & Oncology

## 2022-10-01 DIAGNOSIS — D751 Secondary polycythemia: Secondary | ICD-10-CM

## 2022-10-01 NOTE — Plan of Care (Signed)
THERAPEUTIC PHLEBOTOMY    Recent Lab Results    Lab Results   Component Value Date    WBC 4.0 08/21/2022    HGB 17.2 08/21/2022    HCT 51.3 (H) 08/21/2022    MCV 93 08/21/2022    PLT 176 08/21/2022     Lab Results   Component Value Date    IRON 108 08/21/2022    TIBC 352 08/21/2022    FERRITIN 52 08/21/2022       Pre-phlebotomy Vital Signs     There were no vitals filed for this visit.      Tourniquet placed and arm assessed. Area of venous access cleansed with alcohol. PIV inserted into the left antecubital site. Blood flowing well into collection bag.     Needle removed from patient's arm.  Pressure applied to patient's arm until bleeding stopped. Dry dressing applied over puncture site and secured with Coban/self-adherent wrap.    Final amount of blood removed: 500  Post Vital Signs: Refer to Doc flow sheet  Post-Hydration (If applicable):    Patient tolerated procedure well.    Discharge Instructions provided to increase fluid intake, eat regularly and don't skip any meals, change positions slowly, no strenuous activity and keep dressing intact for 2 hours.

## 2022-10-08 ENCOUNTER — Encounter

## 2022-10-09 ENCOUNTER — Other Ambulatory Visit: Admit: 2022-10-09 | Discharge: 2022-10-09 | Payer: BLUE CROSS/BLUE SHIELD | Primary: Hematology & Oncology

## 2022-10-09 ENCOUNTER — Inpatient Hospital Stay: Admit: 2022-10-09 | Payer: BLUE CROSS/BLUE SHIELD | Primary: Hematology & Oncology

## 2022-10-09 ENCOUNTER — Encounter

## 2022-10-09 ENCOUNTER — Encounter: Payer: BLUE CROSS/BLUE SHIELD | Primary: Hematology & Oncology

## 2022-10-09 ENCOUNTER — Ambulatory Visit
Admit: 2022-10-09 | Discharge: 2022-10-09 | Payer: BLUE CROSS/BLUE SHIELD | Attending: Hematology & Oncology | Primary: Hematology & Oncology

## 2022-10-09 DIAGNOSIS — D751 Secondary polycythemia: Secondary | ICD-10-CM

## 2022-10-09 DIAGNOSIS — E611 Iron deficiency: Secondary | ICD-10-CM

## 2022-10-09 LAB — CBC WITH AUTO DIFFERENTIAL
Basophils %: 1 % (ref 0–2)
Basophils Absolute: 0 10*3/uL (ref 0–2)
Eosinophils %: 3 % (ref 0–7)
Eosinophils Absolute: 0.1 10*3/uL (ref 0.0–0.5)
Hematocrit: 48.4 % (ref 38.0–52.0)
Hemoglobin: 15.4 g/dL (ref 13.0–17.3)
Lymphocytes Absolute: 1 10*3/uL (ref 1.0–3.2)
Lymphocytes: 23.9 % (ref 15.0–45.0)
MCH: 31.2 pg (ref 27.0–34.5)
MCHC: 31.9 g/dL (ref 30.0–36.0)
MCV: 97.8 fL (ref 84.0–100.0)
MPV: 5.7 fL — ABNORMAL LOW (ref 7.0–12.2)
Monocytes %: 6.6 10*3/uL (ref 4.0–12.0)
Monocytes Absolute: 0.3 10*3/uL (ref 0.3–1.0)
Neutrophils %: 65.2 % (ref 42.0–74.0)
Neutrophils Absolute: 2.8 10*3/uL (ref 1.6–7.3)
Platelets: 183 10*3/uL (ref 140–440)
RBC: 4.95 10*3/uL (ref 4.00–5.60)
RDW: 13.7 % (ref 10.0–17.0)
WBC: 4.2 10*3/uL (ref 3.8–10.6)

## 2022-10-09 LAB — FERRITIN: Ferritin: 25.8 ng/mL — ABNORMAL LOW (ref 30.0–400.0)

## 2022-10-09 NOTE — Progress Notes (Signed)
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  He has been feeling well.  He has not noted any bladder issues or evidence of hemoglobinuria.  He has not required recent phlebotomy.  He is concerned about his decreased GFR.  He continues on testosterone replacement therapy.  He denies any pains.  He has had no fevers chills or infection.          ONCOLOGIC HISTORY:  Oncology History    No history exists.        PAST MEDICAL HISTORY:  Past Medical History:   Diagnosis Date    Hypogonadism in male        PAST SURGICAL HISTORY:  No past surgical history on file.    HOME MEDICATIONS:  Current Outpatient Medications   Medication Sig Dispense Refill    Testosterone 30 MG/ACT SOLN Apply 5 actuations transdermally daily, equivalent to a daily dose of 150 mg testosterone.  Each actuation is 1.5 ml and contains 30 mg testosterone. Dispense 8 bottles, each bottle containing 90 ml, for a 90-day supply and 1 refill. 90 each 1     No current facility-administered medications for this visit.        ALLERGIES:  Gadolinium, Iodides, and Iodinated contrast media    SOCIAL HISTORY:  Social History     Socioeconomic History    Marital status: Married   Tobacco Use    Smoking status: Never    Smokeless tobacco: Never   Vaping Use    Vaping Use: Never used   Substance and Sexual Activity    Alcohol use: Never    Drug use: Never        REVIEW OF SYSTEMS:  Review of Systems   Constitutional:  Negative for activity change, appetite change, chills, fatigue, fever and unexpected weight change.   HENT:  Negative for sinus pain and sore throat.    Respiratory:  Negative for cough, chest tightness, shortness of breath and wheezing.    Cardiovascular:  Negative for chest pain and palpitations.    Gastrointestinal:  Negative for abdominal distention, abdominal pain, blood in stool, constipation, diarrhea, nausea and vomiting.   Genitourinary:  Negative for difficulty urinating, dysuria, hematuria and urgency.   Musculoskeletal:  Negative for arthralgias, joint swelling, myalgias and neck stiffness.   Skin:  Negative for rash.   Neurological:  Negative for dizziness, syncope, weakness, light-headedness, numbness and headaches.   Hematological:  Negative for adenopathy. Does not bruise/bleed easily.   Psychiatric/Behavioral:  Negative for confusion and hallucinations.             VITAL SIGNS:  BP 132/75 (Site: Right Upper Arm, Position: Sitting, Cuff Size: Large Adult)   Pulse 70   Temp 98.6 F (37 C)   Wt 103.3 kg (227 lb 12.8 oz)   SpO2 96%   BMI 30.90 kg/m     PHYSICAL EXAM:    Constitutional:  Normal appearance; no acute distress  Eyes:  Conjunctiva normal; eyelids normal; sclera anicteric  Ear, Nose, Throat:  External ears and nose normal; hearing grossly normal; oropharynx unremarkable  Neck:  Supple; no masses; no adenopathy  Respiratory:  Breathing  comfortably; lungs clear to auscultation and percussion  Cardiovascular:  Normal heart sounds; regular rate and rhythm; no peripheral edema  Abdomen:  No masses; no tenderness; no hepatomegaly; no splenomegaly; bowel sounds normal  Lymph nodes:  No cervical, axillary, supraclavicular or inguinal adenopathy  Skin:  No rash; no petechiae; no ecchymoses; no pallor; no cutaneous nodules  Musculoskeletal:  Normal muscle strength  Neurologic:  No focal motor or sensory deficit; normal gait; no abnormal mental status  Psychiatric:  Oriented to person time and place; mood and affect appropriate to situation; appropriate judgment and insight; memory intact    LABORATORY DATA:     CBC:  Recent Labs     10/09/22  1409   WBC 4.2   RBC 4.95   HGB 15.4   HCT 48.4   MCV 97.8   RDW 13.7   PLT 183     CHEMISTRIES:No results for input(s): "NA", "K", "CL", "CO2",  "BUN", "CREATININE", "GLUCOSE", "PHOS", "MG" in the last 72 hours.    Invalid input(s): "CA"  PT/INR:No results for input(s): "PROTIME", "INR" in the last 72 hours.  APTT:No results for input(s): "APTT" in the last 72 hours.  LIVER PROFILE:No results for input(s): "AST", "ALT", "BILIDIR", "BILITOT", "ALKPHOS" in the last 72 hours.          RADIOLOGY RESULTS:     No results found.      Impression & Plan:      1. Iron deficiency [E61.1]    2. Erythrocytosis    3. Paroxysmal nocturnal hemoglobinuria (PNH) (HCC)          PLAN:  Patient does not show evidence of active hemolysis related to his PNH I reassured him that his creatinine of 1.3 does not represent a serious problem.  He will receive phlebotomy today for his hematocrit of 48.  He will have labs checked in 2 weeks to see if he needs further phlebotomy at that time.  I will see him in follow-up in 3 months.

## 2022-10-09 NOTE — Plan of Care (Signed)
THERAPEUTIC PHLEBOTOMY    Recent Lab Results    Lab Results   Component Value Date    WBC 4.2 10/09/2022    HGB 15.4 10/09/2022    HCT 48.4 10/09/2022    MCV 97.8 10/09/2022    PLT 183 10/09/2022     Lab Results   Component Value Date    IRON 108 08/21/2022    TIBC 352 08/21/2022    FERRITIN 52 08/21/2022       Pre-phlebotomy Vital Signs     There were no vitals filed for this visit.      Tourniquet placed and arm assessed. Area of venous access cleansed with alcohol. PIV inserted into the left antecubital site. Blood flowing well into collection bag.     Needle removed from patient's arm.  Pressure applied to patient's arm until bleeding stopped. Dry dressing applied over puncture site and secured with Coban/self-adherent wrap.    Final amount of blood removed: 500  Post Vital Signs: Refer to Doc flow sheet  Post-Hydration (If applicable):    Patient tolerated procedure well.    Discharge Instructions provided to increase fluid intake, eat regularly and don't skip any meals, change positions slowly, no strenuous activity and keep dressing intact for 2 hours.

## 2022-11-07 ENCOUNTER — Encounter

## 2022-11-11 ENCOUNTER — Inpatient Hospital Stay: Admit: 2022-11-11 | Discharge: 2022-11-11 | Payer: BLUE CROSS/BLUE SHIELD | Primary: Hematology & Oncology

## 2022-11-11 DIAGNOSIS — D751 Secondary polycythemia: Secondary | ICD-10-CM

## 2022-11-26 ENCOUNTER — Encounter: Primary: Hematology & Oncology

## 2022-11-27 ENCOUNTER — Other Ambulatory Visit: Admit: 2022-11-27 | Discharge: 2022-11-27 | Payer: BLUE CROSS/BLUE SHIELD | Primary: Hematology & Oncology

## 2022-11-27 ENCOUNTER — Inpatient Hospital Stay: Admit: 2022-11-27 | Discharge: 2022-11-27 | Payer: BLUE CROSS/BLUE SHIELD | Primary: Hematology & Oncology

## 2022-11-27 ENCOUNTER — Inpatient Hospital Stay: Admit: 2022-11-27 | Payer: BLUE CROSS/BLUE SHIELD | Primary: Hematology & Oncology

## 2022-11-27 VITALS — BP 114/64 | HR 78

## 2022-11-27 DIAGNOSIS — D751 Secondary polycythemia: Secondary | ICD-10-CM

## 2022-11-27 DIAGNOSIS — E611 Iron deficiency: Secondary | ICD-10-CM

## 2022-11-27 LAB — CBC WITH AUTO DIFFERENTIAL
Basophils %: 1 % (ref 0–2)
Basophils Absolute: 0 10*3/uL (ref 0–2)
Eosinophils %: 4.1 % (ref 0.0–7.0)
Eosinophils Absolute: 0.2 10*3/uL (ref 0.0–0.5)
Hematocrit: 46.1 % (ref 38.0–52.0)
Hemoglobin: 14.9 g/dL (ref 13.0–17.3)
Lymphocytes Absolute: 0.9 10*3/uL — ABNORMAL LOW (ref 1.0–3.2)
Lymphocytes: 22 % (ref 15.0–45.0)
MCH: 30 pg (ref 27.0–34.5)
MCHC: 32.3 g/dL (ref 30.0–36.0)
MCV: 93 fL (ref 84.0–100.0)
MPV: 6.2 fL — ABNORMAL LOW (ref 7.0–12.2)
Monocytes %: 5.7 10*3/uL (ref 4.0–12.0)
Monocytes Absolute: 0.2 10*3/uL — ABNORMAL LOW (ref 0.3–1.0)
Neutrophils %: 67.4 % (ref 42.0–74.0)
Neutrophils Absolute: 2.6 10*3/uL (ref 1.6–7.3)
Platelets: 184 10*3/uL (ref 140–440)
RBC: 4.96 10*3/uL (ref 4.00–5.60)
RDW: 13.7 % (ref 10.0–17.0)
WBC: 3.9 10*3/uL (ref 3.8–10.6)

## 2022-11-27 LAB — COMPREHENSIVE METABOLIC PANEL
ALT: 22 U/L (ref 0–50)
AST: 25 U/L (ref 0–50)
Albumin/Globulin Ratio: 1.6 (ref 1.0–2.7)
Albumin: 4.5 g/dL (ref 3.5–5.2)
Alk Phosphatase: 46 U/L (ref 40–130)
Anion Gap: 4 mmol/L (ref 2–17)
BUN/Creatinine Ratio: 17 (ref 6–17)
BUN: 26 mg/dL — ABNORMAL HIGH (ref 6–20)
CO2: 32 mmol/L — ABNORMAL HIGH (ref 22–29)
Calcium: 9 mg/dL (ref 8.6–10.0)
Chloride: 104 mmol/L (ref 98–107)
Creatinine: 1.5 mg/dL — ABNORMAL HIGH (ref 0.7–1.0)
Est, Glom Filt Rate: 55 mL/min/1.73mÂ² — ABNORMAL LOW (ref >=60)
Globulin: 2.9 g/dL (ref 1.9–4.4)
Glucose: 97 mg/dL (ref 70–99)
Potassium: 4.7 mmol/L (ref 3.5–5.3)
Sodium: 140 mmol/L (ref 135–145)
Total Bilirubin: 0.6 mg/dL (ref 0.00–1.20)
Total Protein: 7.4 g/dL (ref 6.4–8.3)

## 2022-11-28 LAB — FERRITIN: Ferritin: 19.4 ng/mL — ABNORMAL LOW (ref 30.0–400.0)

## 2023-01-05 ENCOUNTER — Encounter

## 2023-01-08 ENCOUNTER — Ambulatory Visit
Admit: 2023-01-08 | Discharge: 2023-01-08 | Payer: BLUE CROSS/BLUE SHIELD | Attending: Physician Assistant | Primary: Hematology & Oncology

## 2023-01-08 ENCOUNTER — Inpatient Hospital Stay: Admit: 2023-01-08 | Payer: BLUE CROSS/BLUE SHIELD | Primary: Hematology & Oncology

## 2023-01-08 ENCOUNTER — Other Ambulatory Visit: Admit: 2023-01-08 | Discharge: 2023-01-08 | Payer: BLUE CROSS/BLUE SHIELD | Primary: Hematology & Oncology

## 2023-01-08 DIAGNOSIS — D751 Secondary polycythemia: Secondary | ICD-10-CM

## 2023-01-08 LAB — CBC WITH AUTO DIFFERENTIAL
Basophils %: 0.9 % (ref 0.0–2.0)
Basophils Absolute: 0 10*3/uL (ref 0–2)
Eosinophils %: 4.4 % (ref 0.0–7.0)
Eosinophils Absolute: 0.2 10*3/uL (ref 0.0–0.5)
Hematocrit: 46.3 % (ref 38.0–52.0)
Hemoglobin: 14.6 g/dL (ref 13.0–17.3)
Lymphocytes Absolute: 0.9 10*3/uL — ABNORMAL LOW (ref 1.0–3.2)
Lymphocytes: 18.9 % (ref 15.0–45.0)
MCH: 28.8 pg (ref 27.0–34.5)
MCHC: 31.6 g/dL (ref 30.0–36.0)
MCV: 91.3 fL (ref 84.0–100.0)
MPV: 6.1 fL — ABNORMAL LOW (ref 7.0–12.2)
Monocytes %: 6.9 10*3/uL (ref 4.0–12.0)
Monocytes Absolute: 0.3 10*3/uL (ref 0.3–1.0)
Neutrophils %: 68.9 % (ref 42.0–74.0)
Neutrophils Absolute: 3.3 10*3/uL (ref 1.6–7.3)
Platelets: 195 10*3/uL (ref 140–440)
RBC: 5.07 10*3/uL (ref 4.00–5.60)
RDW: 14 % (ref 10.0–17.0)
WBC: 4.8 10*3/uL (ref 3.8–10.6)

## 2023-01-08 LAB — FERRITIN: Ferritin: 34.2 ng/mL (ref 30.0–400.0)

## 2023-01-08 NOTE — Progress Notes (Signed)
[facility]   Outpatient Follow-up Note          Chief Complaint:   Chief Complaint   Patient presents with    Discuss Labs    Follow-up         Principal Diagnosis:  1. Erythrocytosis    2. Iron deficiency [E61.1]    3. Paroxysmal nocturnal hemoglobinuria (PNH) (HCC)         Treatment History:  Oncology History    No history exists.       History of Present Illness:   The patient is a 57 y.o.male who presents for follow-up regarding history of secondary erythrocytosis and history of PNH at 13.  He continues phlebotomy every 6 weeks as indicated for hematocrit greater than 48.  He had developed iatrogenic iron deficiency.  He continues AndroGel for hypogonadism managed by Dr. Molli Knock.    He admits slight fatigue although admits he sleeps poorly.  He continues iron 3 times weekly for atherogenic iron deficiency.  He has had a phlebotomy every 2 weeks x 3 as he was under the impression his hematocrit needed to be less than 42 we have discussed goals of therapy today.    Hematocrit is 46.3 today.      Past Medical/Social History:   Past Medical History:   Diagnosis Date    Hypogonadism in male     Paroxysmal nocturnal hemoglobinuria (PNH) (HCC) 02/19/2021   paroxysmal nocturnal hemoglobinuria (PNH) diagnosed 1987 who is s/p Halotestin and Prednisone, and ATG treatment in December of 1989.       Current Medications:   Reviewed and reconciled.  Current Outpatient Medications   Medication Sig Dispense Refill    Testosterone 30 MG/ACT SOLN Apply 5 actuations transdermally daily, equivalent to a daily dose of 150 mg testosterone.  Each actuation is 1.5 ml and contains 30 mg testosterone. Dispense 8 bottles, each bottle containing 90 ml, for a 90-day supply and 1 refill. 90 each 1     No current facility-administered medications for this visit.         Allergies: Reviewed and unchanged.    ROS:   Review of Systems     Physical Exam:   VITALS:    BP 134/84   Pulse 67   Temp 98.2 F (36.8 C)   Wt 96.3 kg (212  lb 6.4 oz)   SpO2 98%   BMI 28.81 kg/m      ECOG STATUS: 0    Constitutional: no acute distress  CV: RRR, normal S1, S2  Lungs: Clear to auscultation bilaterally, breathing comfortably  Abdomen: Soft, nontender, + BS  Neuro: no focal deficits, alert and oriented x3  Ext: no edema     Diagnostic Data:     Lab on 01/08/2023   Component Date Value Ref Range Status    WBC 01/08/2023 4.8  3.8 - 10.6 x10e3/mcL Final    RBC 01/08/2023 5.07  4.00 - 5.60 x10e3/mcL Final    Hemoglobin 01/08/2023 14.6  13.0 - 17.3 g/dL Final    Hematocrit 16/12/9602 46.3  38.0 - 52.0 % Final    MCV 01/08/2023 91.3  84.0 - 100.0 fL Final    MCH 01/08/2023 28.8  27.0 - 34.5 pg Final    MCHC 01/08/2023 31.6  30.0 - 36.0 g/dL Final    RDW 54/11/8117 14.0  10.0 - 17.0 % Final    Platelets 01/08/2023 195  140 - 440 x10e3/mcL Final    MPV 01/08/2023 6.1 (L)  7.0 -  12.2 fL Final    Neutrophils % 01/08/2023 68.9  42.0 - 74.0 % Final    Lymphocytes 01/08/2023 18.9  15.0 - 45.0 % Final    Monocytes % 01/08/2023 6.9  4.0 - 12.0 x10e3/mcL Final    Eosinophils % 01/08/2023 4.4  0.0 - 7.0 % Final    Basophils % 01/08/2023 0.9  0.0 - 2.0 % Final    Neutrophils Absolute 01/08/2023 3.3  1.6 - 7.3 x10e3/mcL Final    Lymphocytes Absolute 01/08/2023 0.9 (L)  1.0 - 3.2 x10e3/mcL Final    Monocytes Absolute 01/08/2023 0.3  0.3 - 1.0 x10e3/mcL Final    Eosinophils Absolute 01/08/2023 0.2  0.0 - 0.5 x10e3/mcL Final    Basophils Absolute 01/08/2023 0  0 - 2 x10e3/mcL Final          Assessment/Plan:    Secondary erythrocytosis  iatrogenic iron deficiency  Hypogonadism on testosterone replacement  History of PNH 1987 treated with Halotestin prednisone and ATG      We discussed treatment parameters.  We have recommended every 6-week CBC with phlebotomy for hematocrit greater than 48%.  Will see him in 12 weeks for follow-up.    He will continue oral iron 3 times weekly.    Esaw Dace PA-C  Lysbeth Penner, MD)  [facility]  01/08/2023

## 2023-01-09 ENCOUNTER — Ambulatory Visit: Payer: BLUE CROSS/BLUE SHIELD | Primary: Hematology & Oncology

## 2023-02-18 ENCOUNTER — Encounter: Admit: 2023-02-18 | Admitting: Physician Assistant

## 2023-02-18 DIAGNOSIS — D751 Secondary polycythemia: Secondary | ICD-10-CM

## 2023-03-06 ENCOUNTER — Encounter: Admit: 2023-03-06 | Payer: BLUE CROSS/BLUE SHIELD | Admitting: "Endocrinology | Primary: Hematology & Oncology

## 2023-03-06 DIAGNOSIS — E291 Testicular hypofunction: Secondary | ICD-10-CM

## 2023-03-06 MED ORDER — TESTOSTERONE 30 MG/ACT TD SOLN
30 | TRANSDERMAL | 1 refills | Status: AC
Start: 2023-03-06 — End: 2023-09-04

## 2023-03-06 NOTE — Telephone Encounter (Signed)
 Patient is returning a missed call.

## 2023-03-06 NOTE — Progress Notes (Signed)
 [facility]  Tel: [phone]  Fax: [phone]       Endocrinology Progress Note           Chief Complaint   Patient presents with    Follow-up     No recent labs        HPI:    11/15/21   Kevin Richards (DOB:  19-Mar-1966) is a 57 y.o. male

## 2023-03-06 NOTE — Telephone Encounter (Signed)
Patient is calling requesting a CBC lab be put in. Please assist

## 2023-03-07 NOTE — Addendum Note (Signed)
 Addended by: Celso Amy on: 03/07/2023 06:25 PM     Modules accepted: Orders

## 2023-03-08 LAB — CBC/DIFF AMBIGUOUS DEFAULT
Basophils %: 1 %
Basophils Absolute: 0 10*3/uL (ref 0.0–0.2)
Eosinophils %: 5 %
Eosinophils Absolute: 0.2 10*3/uL (ref 0.0–0.4)
Hematocrit: 54.4 % — ABNORMAL HIGH (ref 37.5–51.0)
Hemoglobin: 17.4 g/dL (ref 13.0–17.7)
Immature Grans (Abs): 0 10*3/uL (ref 0.0–0.1)
Immature Granulocytes %: 0 %
Lymphocytes %: 18 %
Lymphocytes Absolute: 0.8 10*3/uL (ref 0.7–3.1)
MCH: 29.9 pg (ref 26.6–33.0)
MCHC: 32 g/dL (ref 31.5–35.7)
MCV: 94 fL (ref 79–97)
Monocytes %: 7 %
Monocytes Absolute: 0.3 10*3/uL (ref 0.1–0.9)
Neutrophils %: 69 %
Neutrophils Absolute: 3.2 10*3/uL (ref 1.4–7.0)
Platelets: 178 10*3/uL (ref 150–450)
RBC: 5.81 x10E6/uL — ABNORMAL HIGH (ref 4.14–5.80)
RDW: 15.6 % — ABNORMAL HIGH (ref 11.6–15.4)
WBC: 4.6 10*3/uL (ref 3.4–10.8)

## 2023-03-08 LAB — ESTRADIOL: Estradiol: 25.6 pg/mL (ref 7.6–42.6)

## 2023-03-08 LAB — PSA, DIAGNOSTIC: PSA: 2.2 ng/mL (ref 0.0–4.0)

## 2023-03-11 LAB — TESTOSTERONE, FREE/TOTAL EQUILIBRIUM ULTRAFILTRATION
Testosterone % Free: 4.09 % (ref 1.50–4.20)
Testosterone, Free: 35.83 ng/dL — ABNORMAL HIGH (ref 5.00–21.00)
Testosterone: 876 ng/dL (ref 264–916)

## 2023-03-17 ENCOUNTER — Inpatient Hospital Stay
Admit: 2023-03-17 | Discharge: 2023-03-17 | Disposition: A | Discharge status: Home / Self Care | Payer: BLUE CROSS/BLUE SHIELD | Admitting: Physician Assistant | Primary: Hematology & Oncology

## 2023-03-17 VITALS — BP 119/75 | HR 79 | Temp 98.20000°F

## 2023-03-17 DIAGNOSIS — D751 Secondary polycythemia: Principal | ICD-10-CM

## 2023-03-31 ENCOUNTER — Encounter: Admit: 2023-03-31 | Admitting: Physician Assistant

## 2023-03-31 ENCOUNTER — Encounter: Admit: 2023-03-31 | Admitting: "Endocrinology

## 2023-03-31 DIAGNOSIS — E291 Testicular hypofunction: Secondary | ICD-10-CM

## 2023-03-31 DIAGNOSIS — D751 Secondary polycythemia: Secondary | ICD-10-CM

## 2023-03-31 MED ORDER — SYRINGE (DISPOSABLE) 1 ML MISC
1 | 3 refills | Status: AC
Start: 2023-03-31 — End: ?

## 2023-03-31 MED ORDER — TESTOSTERONE CYPIONATE 200 MG/ML IM SOLN
200 | INTRAMUSCULAR | 3 refills | Status: AC
Start: 2023-03-31 — End: 2023-09-29

## 2023-03-31 NOTE — Telephone Encounter (Signed)
 Spoke to pt. Informed pt that Dr. Molli Knock will be out of the office this week and will be returning on Monday 04/07/2023. Pt stated that [place] and wanted to know how to go about traveling with medicine and getting an injec

## 2023-03-31 NOTE — Addendum Note (Signed)
 Addended by: Celso Amy on: 03/31/2023 06:24 PM     Modules accepted: Orders

## 2023-04-01 NOTE — Telephone Encounter (Signed)
 Prior authorization initiated and approved. I spoke with patient and advised.

## 2023-04-06 ENCOUNTER — Encounter: Admit: 2023-04-06 | Admitting: Physician Assistant

## 2023-04-06 DIAGNOSIS — D751 Secondary polycythemia: Secondary | ICD-10-CM

## 2023-04-09 ENCOUNTER — Inpatient Hospital Stay: Admit: 2023-04-09 | Payer: BLUE CROSS/BLUE SHIELD | Primary: Hematology & Oncology

## 2023-04-09 ENCOUNTER — Inpatient Hospital Stay: Admit: 2023-04-09 | Discharge: 2023-04-09 | Payer: BLUE CROSS/BLUE SHIELD | Primary: Hematology & Oncology

## 2023-04-09 ENCOUNTER — Ambulatory Visit
Admit: 2023-04-09 | Discharge: 2023-04-09 | Payer: BLUE CROSS/BLUE SHIELD | Attending: Hematology & Oncology | Primary: Hematology & Oncology

## 2023-04-09 ENCOUNTER — Other Ambulatory Visit: Admit: 2023-04-09 | Discharge: 2023-04-09 | Payer: BLUE CROSS/BLUE SHIELD | Primary: Hematology & Oncology

## 2023-04-09 VITALS — BP 144/87 | HR 71 | Temp 98.40000°F | Wt 223.6 lb

## 2023-04-09 VITALS — BP 129/80 | HR 89

## 2023-04-09 DIAGNOSIS — E611 Iron deficiency: Secondary | ICD-10-CM

## 2023-04-09 DIAGNOSIS — D751 Secondary polycythemia: Secondary | ICD-10-CM

## 2023-04-09 LAB — COMPREHENSIVE METABOLIC PANEL
ALT: 24 U/L (ref 0–42)
AST: 31 U/L (ref 0–46)
Albumin/Globulin Ratio: 1.48 (ref 1.00–2.70)
Albumin: 4.3 g/dL (ref 3.5–5.2)
Alk Phosphatase: 49 U/L (ref 40–130)
Anion Gap: 5 mmol/L (ref 2–17)
BUN: 44 mg/dL — ABNORMAL HIGH (ref 6–20)
CO2: 29 mmol/L (ref 22–29)
Calcium: 9.4 mg/dL (ref 8.6–10.0)
Chloride: 104 mmol/L (ref 98–107)
Creatinine: 1.4 mg/dL — ABNORMAL HIGH (ref 0.7–1.3)
Est, Glom Filt Rate: 57 mL/min/1.73mÂ² — ABNORMAL LOW (ref >=60)
Globulin: 2.9 g/dL (ref 1.9–4.4)
Glucose: 82 mg/dL (ref 70–99)
Potassium: 4.4 mmol/L (ref 3.5–5.3)
Sodium: 138 mmol/L (ref 135–145)
Total Bilirubin: 0.3 mg/dL (ref 0.00–1.20)
Total Protein: 7.2 g/dL (ref 6.4–8.3)

## 2023-04-09 LAB — CBC WITH AUTO DIFFERENTIAL
Basophils %: 1 % (ref 0.0–2.0)
Basophils Absolute: 0 10*3/uL (ref 0–2)
Eosinophils %: 4.1 % (ref 0.0–7.0)
Eosinophils Absolute: 0.2 10*3/uL (ref 0.0–0.5)
Hematocrit: 52.9 % — ABNORMAL HIGH (ref 38.0–52.0)
Hemoglobin: 16.5 g/dL (ref 13.0–17.3)
Lymphocytes Absolute: 0.9 10*3/uL — ABNORMAL LOW (ref 1.0–3.2)
Lymphocytes: 19.5 % (ref 15.0–45.0)
MCH: 29 pg (ref 27.0–34.5)
MCHC: 31.1 g/dL (ref 30.0–36.0)
MCV: 93.1 fL (ref 84.0–100.0)
MPV: 6.7 fL — ABNORMAL LOW (ref 7.0–12.2)
Monocytes %: 6.8 10*3/uL (ref 4.0–12.0)
Monocytes Absolute: 0.3 10*3/uL (ref 0.3–1.0)
Neutrophils %: 68.6 % (ref 42.0–74.0)
Neutrophils Absolute: 3.1 10*3/uL (ref 1.6–7.3)
Platelets: 180 10*3/uL (ref 140–440)
RBC: 5.68 10*3/uL — ABNORMAL HIGH (ref 4.00–5.60)
RDW: 14.1 % (ref 10.0–17.0)
WBC: 4.5 10*3/uL (ref 3.8–10.6)

## 2023-04-09 LAB — FERRITIN: Ferritin: 29.8 ng/mL — ABNORMAL LOW (ref 30.0–400.0)

## 2023-04-09 NOTE — Progress Notes (Signed)
Patient: Kevin Richards  DOB: 05-01-1965 (58 y.o.)  Date: 04/09/2023          CHIEF COMPLAINT:  Chief Complaint   Patient presents with    Follow-up    Discuss Labs          HISTORY OF PRESENT ILLNESS:     Patient returns for follow-up and management of his erythrocytosis and history of PNH.  He continues on AndroGel for hypogonadism managed by Dr. Molli Knock.  He reports he is feeling relatively well.  He continues on oral iron supplements he has had frequent phlebotomy with an attempt to keep his hematocrit below 48.  He was unable to have a successful phlebotomy at [facility] last month and came to [place] for this.  He has had no fevers or chills.  He denies any recent infection he has not had any chest abdominal pain.          ONCOLOGIC HISTORY:  Oncology History    No history exists.        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    HOME MEDICATIONS:  Current Outpatient Medications   Medication Sig Dispense Refill    testosterone cypionate (DEPOTESTOTERONE CYPIONATE) 200 MG/ML injection Administer 1ml (200 mg) intramuscularly one time, as a substitute for the gel, to last the patient 2 weeks, due to travel situation. 1 mL 3    Syringe, Disposable, 1 ML MISC Use to inject testosterone Intramuscularly G25 1 ml syringe. 1 each 3    Testosterone 30 MG/ACT SOLN Apply 5 actuations transdermally daily, equivalent to a daily dose of 150 mg testosterone.  Each actuation is 1.5 ml and contains 30 mg testosterone. Dispense 8 bottles, each bottle containing 90 ml, for a 90-day supply and 1 refill. 90 each 1     No current facility-administered medications for this visit.        ALLERGIES:  Gadolinium, Iodides, and Iodinated contrast media    SOCIAL HISTORY:  Social History     Socioeconomic History    Marital status: Married   Tobacco Use     Smoking status: Never    Smokeless tobacco: Never   Vaping Use    Vaping status: Never Used   Substance and Sexual Activity    Alcohol use: Never    Drug use: Never     Social Determinants of Health     Financial Resource Strain: Low Risk  (10/01/2021)    Received from [facility], [facility]    Overall Financial Resource Strain (CARDIA)     Difficulty of Paying Living Expenses: Not hard at all   Food Insecurity: No Food Insecurity (10/01/2021)    Received from [facility], [facility]    Hunger Vital Sign     Worried About Running Out of Food in the Last Year: Never true     Ran Out of Food in the Last Year: Never true   Transportation Needs: No Transportation Needs (10/01/2021)    Received from [facility], [facility]    PRAPARE - Transportation     Lack of Transportation (Medical): No     Lack of Transportation (Non-Medical): No   Physical Activity: Sufficiently Active (10/01/2021)    Received from [facility], [facility]    Exercise Vital Sign  Days of Exercise per Week: 6 days     Minutes of Exercise per Session: 40 min   Stress: No Stress Concern Present (10/01/2021)    Received from [facility], [facility] - Occupational Stress Questionnaire     Feeling of Stress : Only a little   Social Connections: Moderately Isolated (10/01/2021)    Received from [facility]    Social Connection and Isolation Panel [NHANES]     Frequency of Communication with Friends and Family: More than three times a week     Frequency of Social Gatherings with Friends and Family: Once a week     Attends Religious Services: Never     Database administrator or Organizations: No     Attends Banker Meetings: Never     Marital Status: Married   Catering manager Violence: Not At Risk (10/01/2021)    Received from [facility], [facility]    Humiliation, Afraid, Rape, and Kick questionnaire     Fear of Current or Ex-Partner: No     Emotionally Abused:  No     Physically Abused: No     Sexually Abused: No   Housing Stability: Low Risk  (10/01/2021)    Received from [facility], [facility]    Housing Stability Vital Sign     Unable to Pay for Housing in the Last Year: No     Number of Places Lived in the Last Year: 1     Unstable Housing in the Last Year: No        REVIEW OF SYSTEMS:  Review of Systems   Constitutional:  Negative for activity change, appetite change, chills, fatigue, fever and unexpected weight change.   HENT:  Negative for sinus pain and sore throat.    Respiratory:  Negative for cough, chest tightness, shortness of breath and wheezing.    Cardiovascular:  Negative for chest pain and palpitations.   Gastrointestinal:  Negative for abdominal distention, abdominal pain, blood in stool, constipation, diarrhea, nausea and vomiting.   Genitourinary:  Negative for difficulty urinating, dysuria, hematuria and urgency.   Musculoskeletal:  Negative for arthralgias, joint swelling, myalgias and neck stiffness.   Skin:  Negative for rash.   Neurological:  Negative for dizziness, syncope, weakness, light-headedness, numbness and headaches.   Hematological:  Negative for adenopathy. Does not bruise/bleed easily.   Psychiatric/Behavioral:  Negative for confusion and hallucinations.             VITAL SIGNS:  BP (!) 144/87   Pulse 71   Temp 98.4 F (36.9 C)   Wt 101.4 kg (223 lb 9.6 oz)   SpO2 95%   BMI 30.33 kg/m     PHYSICAL EXAM:    Constitutional:  Normal appearance; no acute distress  Eyes:  Conjunctiva normal; eyelids normal; sclera anicteric  Ear, Nose, Throat:  External ears and nose normal; hearing grossly normal; oropharynx unremarkable  Neck:  Supple; no masses; no adenopathy  Respiratory:  Breathing comfortably; lungs clear to auscultation and percussion  Cardiovascular:  Normal heart sounds; regular rate and rhythm; no peripheral edema  Abdomen:  No masses; no tenderness; no hepatomegaly; no splenomegaly; bowel sounds normal  Lymph nodes:   No cervical, axillary, supraclavicular or inguinal adenopathy  Skin:  No rash; no petechiae; no ecchymoses; no pallor; no cutaneous nodules  Musculoskeletal:  Normal muscle strength  Neurologic:  No focal motor or sensory deficit; normal gait; no  abnormal mental status  Psychiatric:  Oriented to person time and place; mood and affect appropriate to situation; appropriate judgment and insight; memory intact    LABORATORY DATA:     CBC:  Recent Labs     04/09/23  1428   WBC 4.5   RBC 5.68*   HGB 16.5   HCT 52.9*   MCV 93.1   RDW 14.1   PLT 180     CHEMISTRIES:  Recent Labs     04/09/23  1428   NA 138   K 4.4   CL 104   CO2 29   BUN 44*   CREATININE 1.4*   GLUCOSE 82     PT/INR:No results for input(s): "PROTIME", "INR" in the last 72 hours.  APTT:No results for input(s): "APTT" in the last 72 hours.  LIVER PROFILE:  Recent Labs     04/09/23  1428   AST 31   ALT 24   BILITOT 0.30   ALKPHOS 49             RADIOLOGY RESULTS:     No results found.      Impression & Plan:      1. Iron deficiency [E61.1]    2. Erythrocytosis          PLAN:  Patient is stable and will continue phlebotomy for hematocrit greater than 48.  He will receive phlebotomy today.  He will have a CBC checked in 3 to 4 weeks.  I will see him in follow-up in 3 months.

## 2023-04-14 NOTE — Telephone Encounter (Signed)
I SPOKE WITH PATIENT AND ADVISED THAT PRIOR AUTH FOR TESTOSTERONE WAS APPROVED.

## 2023-05-12 ENCOUNTER — Inpatient Hospital Stay: Admit: 2023-05-12 | Discharge: 2023-05-12 | Payer: BLUE CROSS/BLUE SHIELD | Primary: Hematology & Oncology

## 2023-05-12 VITALS — BP 139/97 | HR 81

## 2023-05-12 DIAGNOSIS — D751 Secondary polycythemia: Secondary | ICD-10-CM

## 2023-05-15 ENCOUNTER — Encounter

## 2023-05-23 NOTE — Telephone Encounter (Signed)
 Patient will go to [facility] lab orders were uploaded and he is aware of CBC order and states that he really needs It checked and gets it done regularly. Patient expressed his thanks.

## 2023-05-23 NOTE — Telephone Encounter (Signed)
 Patient is calling     Has question about whether he has labs to be done.  There are no orders in his Mychart    Advised patient that the orders are in from  DR Iatan.    He is requesting to speak with the nurse that he had just got off the phone with.    Please advise

## 2023-05-23 NOTE — Telephone Encounter (Signed)
 I spoke with patient and advised that he does have lab orders to be completed before his appointment with Dr. Molli Knock. Patient expressed his thanks.

## 2023-06-05 ENCOUNTER — Telehealth
Admit: 2023-06-05 | Discharge: 2023-06-05 | Payer: BLUE CROSS/BLUE SHIELD | Attending: "Endocrinology | Primary: Hematology & Oncology

## 2023-06-05 DIAGNOSIS — E291 Testicular hypofunction: Secondary | ICD-10-CM

## 2023-06-05 MED ORDER — TESTOSTERONE 30 MG/ACT TD SOLN
30 | TRANSDERMAL | 1 refills | Status: DC
Start: 2023-06-05 — End: 2023-06-09

## 2023-06-05 MED ORDER — TRIAMCINOLONE ACETONIDE 0.025 % EX OINT
0.025 | CUTANEOUS | 2 refills | Status: AC
Start: 2023-06-05 — End: ?

## 2023-06-05 NOTE — Progress Notes (Signed)
 [facility]  Tel: [phone]  Fax: [phone]       Endocrinology Note        Chief Complaint   Patient presents with    Follow-up     Labs from [facility] scanned in chart         HPI:     11/15/21   Kevin Richards (DOB:  06-12-1965) is a 58 y.o. 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 The patient  was initially diagnosed to have paroxysmal nocturnal hemoglobinuria at the age of 49, and was on various medications  including prednisone and halotestin. At one point, the patient was hospitalized and was given ATG. The patient has not  experienced many symptoms of PNH through the years, but has required treatment with testosterone for hypogonadism. He  moved to [place] around 2015. I increased the dose of his testosterone to 5 packets per day and since then he has felt much  better. He was able to exercise and lose significant amount of weight. Patient reduced his dose of testosterone from 5 packets to 4 packets a day equivalent to 100 mg/day since the last visit because of having polycythemia and having to get multiple phlebotomy visits.  He had labs prior to the appointment and wants to discuss the results but believes the increase in serum testosterone that was detected to be caused by contamination, as the lab was drawn from the same arm where he applied the testosterone gel and was done 1 hour after application.    05/16/22   This is a virtual visit with both, video and audio components, and patient consent was obtained.  Since the last visit, the patient has been taking the medications as instructed, had labs drawn prior to this visit and wants to discuss the results, and he had phlebotomy in January 2024.  He started feeling very tired and realized that it was because of his testosterone level 312.  He contacted me and increase the dose of testosterone packets to 5 packets a day equivalent to 125 mg testosterone  transdermally daily, but his insurance changed and now he is supposed to get the testosterone pump rather than the gel and the pharmacist told him that it is a pump that delivers 30 mg per application available and he would need for applications.    08/15/22   This is a synchronous interactive virtual visit done by video and audio components from provider office to patient home. Patient consent was obtained.   Since the last visit, the patient has been feeling well with no new complaints and has been taking the medications as instructed and had labs drawn prior to this visit and wants to discuss the results He has been complaining of fatigue and was told he had iron deficiency.    09/04/22   This is a synchronous interactive virtual visit done by video and audio components from provider office to patient home. Patient consent was obtained.   Since the last visit, the patient has not been feeling well .  He had asked to decrease the dose of his testosterone from 125 mg/day to 100 mg/day in the past because he was concerned about potential side effects of testosterone treatment, and felt better when we increased the dose back to the current dosage of 120 mg/day using 4 applications of testosterone transdermally on the formulation approved by his insurance.  However, he  is starting to feel tired and lethargic    03/06/23   This is a synchronous interactive virtual visit done by video and audio components from provider office to patient home. Patient consent was obtained.   Since the last visit, the patient has been feeling well with no new complaints , has been taking the medications as instructed, and has not had labs drawn prior to the visit     06/05/23   This is a synchronous interactive virtual visit done by video and audio components from provider office to patient home. Patient consent was obtained.     History of Present Illness  The patient is a 58 year old male who is seen in a virtual follow-up visit for the  management of hypogonadism.    He reports overall good health and has been utilizing testosterone gel as part of his treatment regimen. He has observed an decrease in his testosterone levels compared to the previous results, with the most recent test conducted on 03/27/2023 yielding a result of 473, compared to the previous reading of 876. He also mentions experiencing a burning sensation on his shoulders and abdomen, which he attributes to the application of the gel, with redness of the skin. This discomfort is accompanied by redness and itching in the affected areas. He expresses a desire to explore over-the-counter remedies for these symptoms. He recalls that he did not experience these side effects when using the gel packets, but the onset of these symptoms coincided with his switch to the pump formulation. His insurance provider has indicated a preference for either the injection or pump delivery methods. His current application routine involves 2 actuations on each shoulder and 1 on his abdomen.         Past medical, social, family history and allergies:     Reviewed and documented in respective sections of the chart.        ROS:     Other than what was mentioned in HPI, a thorough 11 point Review of systems was done and was negative.      PE:     Vital Signs:        06/04/2023 05/12/2023 04/09/2023 03/17/2023 03/03/2023 01/08/2023 11/27/2022   Combined Vitals   Blood Pressure  139/97    108/73 129/80    144/87 119/75    144/78  134/84 114/64    120/74   Pulse  81 89    71 79    79  67 78    72   Temp   98.4 F (36.9 C) 98.2 F (36.8 C)  98.2 F (36.8 C)    Weight   223 lb 9.6 oz   212 lb 6.4 oz    Pt-Reported Weight 215    205     SpO2   95 % 97 %  98 %        Multiple values from one day are sorted in reverse-chronological order       There is no height or weight on file to calculate BMI.      General: Well-nourished, in no obvious distress  HEENT: PERRLA, EOMI  Neck: Supple, no JVD  Thyroid: no visible  goiter  Abdomen: Relaxed, nondistended  Extremities: No cyanosis, clubbing or edema  Neurological: No lateralization      Labs:       Review of Lab test results:    WBC   Date Value   04/09/2023 4.5 x10e3/mcL   03/07/2023 4.6 x10E3/uL  Hemoglobin (g/dL)   Date Value   66/44/0347 16.5   03/07/2023 17.4     Hematocrit (%)   Date Value   04/09/2023 52.9 (H)   03/07/2023 54.4 (H)     Platelets   Date Value   04/09/2023 180 x10e3/mcL   03/07/2023 178 x10E3/uL     Sodium (mmol/L)   Date Value   04/09/2023 138   11/27/2022 140     Potassium (mmol/L)   Date Value   04/09/2023 4.4   11/27/2022 4.7     Creatinine (mg/dL)   Date Value   42/59/5638 1.4 (H)   11/27/2022 1.5 (H)     Calcium (mg/dL)   Date Value   75/64/3329 9.4   11/27/2022 9.0     AST (unit/L)   Date Value   04/09/2023 31   11/27/2022 25     ALT (unit/L)   Date Value   04/09/2023 24   11/27/2022 22     Cholesterol ((mg/dL))   Date Value   51/88/4166 165     HDL ((mg/dL))   Date Value   09/29/1599 50     LDL Calculated ((mg/dL))   Date Value   09/32/3557 96     Triglycerides ((mg/dL))   Date Value   32/20/2542 107     TSH, 3rd Generation (mcIU/mL)   Date Value   09/22/2020 2.400   05/24/2020 1.580          ASSESSMENT AND PLAN:     1. Testicular hypofunction  -     Testosterone 30 MG/ACT SOLN; Apply 5 actuations transdermally daily, equivalent to a daily dose of 150 mg testosterone.  Each actuation is 1.5 ml and contains 30 mg testosterone. Dispense 8 bottles, each bottle containing 90 ml, for a 90-day supply and 1 refill., Disp-90 each, R-1Normal  -     PSA Screening; Future  -     Estradiol; Future  -     Testosterone; Future  -     Testosterone, Free/Tot Equilib; Future  -     Ferritin; Future  -     Iron and TIBC; Future  -     CBC with Auto Differential; Future  2. Alopecia  3. Hypogonadism in male  4. Paroxysmal nocturnal hemoglobinuria (PNH) (HCC)  5. Iron deficiency  -     Ferritin; Future  -     Iron and TIBC; Future  -     CBC with Auto Differential;  Future  6. Skin irritation due to topical agent  -     triamcinolone (KENALOG) 0.025 % ointment; Apply topically on site of rash up to one time daily, Disp-1 each, R-2, Normal  7. Skin rash  -     triamcinolone (KENALOG) 0.025 % ointment; Apply topically on site of rash up to one time daily, Disp-1 each, R-2, Normal  8. Other iron deficiency anemia  -     Ferritin; Future  -     Iron and TIBC; Future  -     CBC with Auto Differential; Future    Assessment & Plan  1. Hypogonadism.  His testosterone levels have shown a decrease, which could be attributed to fluctuations in absorption or the timing of gel application relative to blood draw. Despite this, he reports feeling well, indicating that the current dosage is appropriate. The redness and itching at the application site suggest contact dermatitis, likely due to an allergic reaction to the excipient in the testosterone gel. A prescription for triamcinolone  cream will be provided, with instructions to apply a pea-sized amount to each affected area nightly. He is advised to continue applying the testosterone gel but to rotate the application sites to prevent further irritation. If the skin allergy persists, an alternative form of testosterone will be considered. A prescription for the testosterone gel will also be sent to [facility] pharmacy to ensure he does not run out of the medication.    Follow-up  The patient will follow up in 6 months.         Attestation        The patient (or guardian, if applicable) and other individuals in attendance with the patient were advised that Artificial Intelligence will be utilized during this visit to record, process the conversation to generate a clinical note, and support improvement of the AI technology. The patient (or guardian, if applicable) and other individuals in attendance at the appointment consented to the use of AI, including the recording.         An electronic signature was used to authenticate this  note.    --Celso Amy, MD

## 2023-06-07 ENCOUNTER — Encounter

## 2023-06-09 NOTE — Telephone Encounter (Signed)
 Patient is requesting a call back. Patient states that he will not be picking up the Testosterone 30 MG/ACT SOLN prescription that was sent on 06/05/23 due to the rash it cause him. Patient currently has 3 bottles that he can not use. Patient wanted to inform Dr. Molli Knock that he does not needs any more of the Testosterone 30 MG/ACT SOLN  prescriptions sent to the pharmacy. Please send an update prescription for the Testosterone gel that is attached to this MyChart message.  Patient will use [facility] pharmacy located on [address] [place], [place]. Please assist

## 2023-06-10 ENCOUNTER — Inpatient Hospital Stay: Admit: 2023-06-10 | Discharge: 2023-06-10 | Payer: BLUE CROSS/BLUE SHIELD | Primary: Hematology & Oncology

## 2023-06-10 ENCOUNTER — Ambulatory Visit: Payer: BLUE CROSS/BLUE SHIELD | Primary: Hematology & Oncology

## 2023-06-10 VITALS — BP 121/80 | HR 76 | Temp 97.40000°F

## 2023-06-10 DIAGNOSIS — D751 Secondary polycythemia: Secondary | ICD-10-CM

## 2023-06-10 MED ORDER — TESTOSTERONE 20.25 MG/ACT (1.62%) TD GEL
20.25 | TRANSDERMAL | 1 refills | Status: DC
Start: 2023-06-10 — End: 2023-12-26

## 2023-07-01 ENCOUNTER — Encounter

## 2023-07-04 ENCOUNTER — Encounter

## 2023-07-08 ENCOUNTER — Ambulatory Visit: Payer: BLUE CROSS/BLUE SHIELD | Primary: Hematology & Oncology

## 2023-07-09 ENCOUNTER — Ambulatory Visit
Admit: 2023-07-09 | Discharge: 2023-07-09 | Payer: BLUE CROSS/BLUE SHIELD | Attending: Nurse Practitioner | Primary: Hematology & Oncology

## 2023-07-09 ENCOUNTER — Encounter

## 2023-07-09 ENCOUNTER — Inpatient Hospital Stay: Admit: 2023-07-09 | Payer: BLUE CROSS/BLUE SHIELD | Primary: Hematology & Oncology

## 2023-07-09 ENCOUNTER — Inpatient Hospital Stay: Admit: 2023-07-09 | Discharge: 2023-07-09 | Payer: BLUE CROSS/BLUE SHIELD | Primary: Hematology & Oncology

## 2023-07-09 ENCOUNTER — Other Ambulatory Visit: Admit: 2023-07-09 | Discharge: 2023-07-09 | Payer: BLUE CROSS/BLUE SHIELD | Primary: Hematology & Oncology

## 2023-07-09 VITALS — BP 118/60 | HR 76

## 2023-07-09 VITALS — BP 126/77 | HR 76 | Temp 98.80000°F | Wt 227.4 lb

## 2023-07-09 DIAGNOSIS — D751 Secondary polycythemia: Secondary | ICD-10-CM

## 2023-07-09 LAB — COMPREHENSIVE METABOLIC PANEL
ALT: 28 U/L (ref 0–42)
AST: 34 U/L (ref 0–46)
Albumin/Globulin Ratio: 1.81 (ref 1.00–2.70)
Albumin: 4.7 g/dL (ref 3.5–5.2)
Alk Phosphatase: 50 U/L (ref 40–130)
Anion Gap: 6 mmol/L (ref 2–17)
BUN: 43 mg/dL — ABNORMAL HIGH (ref 6–20)
CO2: 29 mmol/L (ref 22–29)
Calcium: 9.5 mg/dL (ref 8.6–10.0)
Chloride: 101 mmol/L (ref 98–107)
Creatinine: 1.4 mg/dL — ABNORMAL HIGH (ref 0.7–1.3)
Est, Glom Filt Rate: 57 mL/min/1.73mÂ² — ABNORMAL LOW (ref >=60)
Globulin: 2.6 g/dL (ref 1.9–4.4)
Glucose: 87 mg/dL (ref 70–99)
Potassium: 4.4 mmol/L (ref 3.5–5.3)
Sodium: 136 mmol/L (ref 135–145)
Total Bilirubin: 0.56 mg/dL (ref 0.00–1.20)
Total Protein: 7.3 g/dL (ref 6.4–8.3)

## 2023-07-09 LAB — CBC WITH AUTO DIFFERENTIAL
Basophils %: 0.7 % (ref 0.0–2.0)
Basophils Absolute: 0 10*3/uL (ref 0–2)
Eosinophils %: 2.5 % (ref 0.0–7.0)
Eosinophils Absolute: 0.1 10*3/uL (ref 0.0–0.5)
Hematocrit: 54.2 % — ABNORMAL HIGH (ref 38.0–52.0)
Hemoglobin: 17.9 g/dL — ABNORMAL HIGH (ref 13.0–17.3)
Lymphocytes Absolute: 0.8 10*3/uL — ABNORMAL LOW (ref 1.0–3.2)
Lymphocytes: 14.8 % — ABNORMAL LOW (ref 15.0–45.0)
MCH: 30.4 pg (ref 27.0–34.5)
MCHC: 32.9 g/dL (ref 30.0–36.0)
MCV: 92.4 fL (ref 84.0–100.0)
MPV: 7.5 fL (ref 7.0–12.2)
Monocytes %: 6.7 10*3/uL (ref 4.0–12.0)
Monocytes Absolute: 0.4 10*3/uL (ref 0.3–1.0)
Neutrophils %: 75.2 % — ABNORMAL HIGH (ref 42.0–74.0)
Neutrophils Absolute: 4.1 10*3/uL (ref 1.6–7.3)
Platelets: 174 10*3/uL (ref 140–440)
RBC: 5.87 10*3/uL — ABNORMAL HIGH (ref 4.00–5.60)
RDW: 12.8 % (ref 10.0–17.0)
WBC: 5.5 10*3/uL (ref 3.8–10.6)

## 2023-07-09 LAB — FERRITIN: Ferritin: 39.6 ng/mL (ref 30.0–400.0)

## 2023-07-09 NOTE — Progress Notes (Signed)
 Patient: Kevin Richards  DOB: Sep 05, 1965 (58 y.o.)  Date: 07/09/2023          CHIEF COMPLAINT:  Chief Complaint   Patient presents with    Follow-up    Discuss Labs        HISTORY OF PRESENT ILLNESS:     He returns for management of erythrocytosis and history of PNH at age 19 he continues on testerone replacement on AndroGel for hypogonadism. He is on phlebotomy for hematocrit greater than 48% and has been receiving every 4 weeks for the last few months. He is taking iron 2-3 times weekly. He denies significant fatigue. He denies nausea or constipation with iron but does have sharp lower abdominal pain that relieves with flatulence. He is sleeping well and his wife has not noted apneic breathing or concerning snoring.     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    HOME MEDICATIONS:  Current Outpatient Medications   Medication Sig Dispense Refill    Testosterone (ANDROGEL) 20.25 MG/ACT (1.62%) GEL gel Testosterone gel: Apply 7 pump actuations topically on skin per day, equivalent to 142 mg of testosterone (8.75 g of gel) daily.  Dispense 90-day supply with 1 refill.  NDC [phone] 900 g 1    triamcinolone (KENALOG) 0.025 % ointment Apply topically on site of rash up to one time daily 1 each 2    Syringe, Disposable, 1 ML MISC Use to inject testosterone Intramuscularly G25 1 ml syringe. 1 each 3     No current facility-administered medications for this visit.        ALLERGIES:  Gadolinium, Iodides, and Iodinated contrast media    SOCIAL HISTORY:  Social History     Socioeconomic History    Marital status: Married   Tobacco Use    Smoking status: Never    Smokeless tobacco: Never   Vaping Use    Vaping status: Never Used   Substance and Sexual Activity    Alcohol use: Never    Drug use: Never      Social Drivers of Psychologist, prison and probation services Strain: Low Risk  (10/01/2021)    Received from [facility], [facility]    Overall Financial Resource Strain (CARDIA)     Difficulty of Paying Living Expenses: Not hard at all   Food Insecurity: No Food Insecurity (10/01/2021)    Received from [facility], [facility]    Hunger Vital Sign     Worried About Running Out of Food in the Last Year: Never true     Ran Out of Food in the Last Year: Never true   Transportation Needs: No Transportation Needs (10/01/2021)    Received from [facility], [facility]    PRAPARE - Transportation     Lack of Transportation (Medical): No     Lack of Transportation (Non-Medical): No   Physical Activity: Sufficiently Active (10/01/2021)    Received from [facility], [facility]    Exercise Vital Sign     Days of Exercise per Week: 6 days     Minutes of Exercise per Session: 40 min   Stress: No Stress Concern Present (10/01/2021)    Received from [facility], [facility]  [employer] of Occupational Health - Occupational Stress Questionnaire     Feeling of Stress : Only a little   Social Connections: Moderately Isolated (10/01/2021)    Received from [facility], [facility]    Social Connection and Isolation Panel [NHANES]     Frequency of Communication with Friends and Family: More than three times a week     Frequency of Social Gatherings with Friends and Family: Once a week     Attends Religious Services: Never     Database administrator or Organizations: No     Attends Banker Meetings: Never     Marital Status: Married   Catering manager Violence: Not At Risk (10/01/2021)    Received from [employer] [facility], [facility]    Humiliation, Afraid, Rape, and Kick questionnaire     Fear of Current or Ex-Partner: No     Emotionally Abused: No     Physically Abused: No     Sexually Abused: No   Housing Stability: Low Risk  (10/01/2021)    Received from [facility], [facility]    Housing Stability Vital  Sign     Unable to Pay for Housing in the Last Year: No     Number of Places Lived in the Last Year: 1     Unstable Housing in the Last Year: No        REVIEW OF SYSTEMS:    Constitutional:  Negative for fever.   HENT:  Negative for mouth sores and trouble swallowing.    Respiratory:  Negative for cough, chest tightness and shortness of breath.    Cardiovascular:  Negative for chest pain and leg swelling.   Gastrointestinal:  Negative for abdominal distention, abdominal pain, blood in stool, constipation, diarrhea, nausea and vomiting.   Genitourinary:  Negative for dysuria.   Skin:  Negative for color change.   Neurological:  Negative for dizziness, speech difficulty and headaches.   Hematological:  Negative for adenopathy.   Psychiatric/Behavioral:  Negative for agitation and confusion.       VITAL SIGNS:  BP 126/77   Pulse 76   Temp 98.8 F (37.1 C)   Wt 103.1 kg (227 lb 6.4 oz)   SpO2 96%   BMI 30.84 kg/m     PHYSICAL EXAM:    Constitutional: no acute distress  CV: RRR, normal S1, S2  Lungs: Clear to auscultation bilaterally, breathing comfortably  Abdomen: Soft, nontender, + BS  Neuro: no focal deficits, alert and oriented x3  Ext: no edema    LABORATORY DATA:        Recent Results (from the past 6 weeks)   Comprehensive Metabolic Panel    Collection Time: 07/09/23  2:42 PM   Result Value Ref Range    Sodium 136 135 - 145 mmol/L    Potassium 4.4 3.5 - 5.3 mmol/L    Chloride 101 98 - 107 mmol/L    CO2 29 22 - 29 mmol/L    Glucose 87 70 - 99 mg/dL    BUN 43 (H) 6 - 20 mg/dL    Creatinine 1.4 (H) 0.7 - 1.3 mg/dL    Anion Gap 6 2 - 17 mmol/L    Calcium 9.5 8.6 - 10.0 mg/dL    Total Protein 7.3 6.4 - 8.3 g/dL    Albumin 4.7 3.5 - 5.2 g/dL    Globulin 2.6 1.9 - 4.4 g/dL    Albumin/Globulin Ratio 1.81 1.00 - 2.70    Total Bilirubin  0.56 0.00 - 1.20 mg/dL    Alk Phosphatase 50 40 - 130 unit/L    AST 34 0 - 46 unit/L    ALT 28 0 - 42 unit/L    Est, Glom Filt Rate 57 (L) >=60 mL/min/1.56m   CBC With Auto  Differential    Collection Time: 07/09/23  2:42 PM   Result Value Ref Range    WBC 5.5 3.8 - 10.6 x10e3/mcL    RBC 5.87 (H) 4.00 - 5.60 x10e3/mcL    Hemoglobin 17.9 (H) 13.0 - 17.3 g/dL    Hematocrit 10.9 (H) 38.0 - 52.0 %    MCV 92.4 84.0 - 100.0 fL    MCH 30.4 27.0 - 34.5 pg    MCHC 32.9 30.0 - 36.0 g/dL    RDW 60.4 54.0 - 98.1 %    Platelets 174 140 - 440 x10e3/mcL    MPV 7.5 7.0 - 12.2 fL    Neutrophils % 75.2 (H) 42.0 - 74.0 %    Lymphocytes 14.8 (L) 15.0 - 45.0 %    Monocytes % 6.7 4.0 - 12.0 x10e3/mcL    Eosinophils % 2.5 0.0 - 7.0 %    Basophils % 0.7 0.0 - 2.0 %    Neutrophils Absolute 4.1 1.6 - 7.3 x10e3/mcL    Lymphocytes Absolute 0.8 (L) 1.0 - 3.2 x10e3/mcL    Monocytes Absolute 0.4 0.3 - 1.0 x10e3/mcL    Eosinophils Absolute 0.1 0.0 - 0.5 x10e3/mcL    Basophils Absolute 0 0 - 2 x10e3/mcL         Impression & Plan:      1. Erythrocytosis    Secondary erythrocytosis  Iatrogenic iron deficiency  Hypogonadism on testosterone replacement  History of PNH 1987 treated with Halotestin prednisone and ATG    His hematocrit is elevated to 54%.  We will increase phlebotomy interval.  He will receive today and return again next week.  We discussed every 2 to 3 weeks from there until hematocrit below 48%.  He will continue oral iron 2-3 times weekly.  Discussed discontinuing with worsening GI side effects    He will return for reevaluation in 3 months  Erling Conte, NP-C  Patient seen jointly with Dr. Everardo All

## 2023-07-15 ENCOUNTER — Other Ambulatory Visit: Admit: 2023-07-15 | Discharge: 2023-07-15 | Payer: BLUE CROSS/BLUE SHIELD | Primary: Hematology & Oncology

## 2023-07-15 ENCOUNTER — Inpatient Hospital Stay: Admit: 2023-07-15 | Discharge: 2023-07-15 | Payer: BLUE CROSS/BLUE SHIELD | Primary: Hematology & Oncology

## 2023-07-15 VITALS — BP 120/87 | HR 89 | Temp 98.40000°F | Resp 16

## 2023-07-15 DIAGNOSIS — D751 Secondary polycythemia: Secondary | ICD-10-CM

## 2023-07-15 LAB — CBC WITH AUTO DIFFERENTIAL
Basophils %: 1.1 % (ref 0.0–2.0)
Basophils Absolute: 0 10*3/uL (ref 0–2)
Eosinophils %: 4.6 % (ref 0.0–7.0)
Eosinophils Absolute: 0.2 10*3/uL (ref 0.0–0.5)
Hematocrit: 51.6 % (ref 38.0–52.0)
Hemoglobin: 17.3 g/dL (ref 13.0–17.3)
Lymphocytes Absolute: 0.9 10*3/uL — ABNORMAL LOW (ref 1.0–3.2)
Lymphocytes: 17.6 % (ref 15.0–45.0)
MCH: 31.6 pg (ref 27.0–34.5)
MCHC: 33.6 g/dL (ref 30.0–36.0)
MCV: 94 fL (ref 84.0–100.0)
MPV: 6.9 fL — ABNORMAL LOW (ref 7.0–12.2)
Monocytes %: 7.4 10*3/uL (ref 4.0–12.0)
Monocytes Absolute: 0.4 10*3/uL (ref 0.3–1.0)
Neutrophils %: 69.2 % (ref 42.0–74.0)
Neutrophils Absolute: 3.5 10*3/uL (ref 1.6–7.3)
Platelets: 170 10*3/uL (ref 140–440)
RBC: 5.49 10*3/uL (ref 4.00–5.60)
RDW: 12.7 % (ref 10.0–17.0)
WBC: 5.1 10*3/uL (ref 3.8–10.6)

## 2023-07-24 ENCOUNTER — Ambulatory Visit: Payer: BLUE CROSS/BLUE SHIELD | Primary: Hematology & Oncology

## 2023-07-24 ENCOUNTER — Encounter: Primary: Hematology & Oncology

## 2023-07-30 ENCOUNTER — Inpatient Hospital Stay: Admit: 2023-07-30 | Discharge: 2023-07-30 | Payer: BLUE CROSS/BLUE SHIELD | Primary: Hematology & Oncology

## 2023-07-30 VITALS — BP 127/80 | HR 79 | Temp 98.20000°F | Resp 16

## 2023-07-30 DIAGNOSIS — D751 Secondary polycythemia: Secondary | ICD-10-CM

## 2023-08-12 ENCOUNTER — Encounter

## 2023-08-18 ENCOUNTER — Inpatient Hospital Stay: Admit: 2023-08-18 | Discharge: 2023-08-18 | Payer: BLUE CROSS/BLUE SHIELD | Primary: Hematology & Oncology

## 2023-08-18 ENCOUNTER — Other Ambulatory Visit: Admit: 2023-08-18 | Discharge: 2023-08-18 | Payer: BLUE CROSS/BLUE SHIELD | Primary: Hematology & Oncology

## 2023-08-18 ENCOUNTER — Inpatient Hospital Stay: Admit: 2023-08-18 | Payer: BLUE CROSS/BLUE SHIELD | Primary: Hematology & Oncology

## 2023-08-18 VITALS — BP 122/79 | HR 60 | Temp 98.10000°F

## 2023-08-18 DIAGNOSIS — D751 Secondary polycythemia: Secondary | ICD-10-CM

## 2023-08-18 LAB — CBC WITH AUTO DIFFERENTIAL
Basophils %: 0.9 % (ref 0.0–2.0)
Basophils Absolute: 0 10*3/uL (ref 0–2)
Eosinophils %: 4.7 % (ref 0.0–7.0)
Eosinophils Absolute: 0.2 10*3/uL (ref 0.0–0.5)
Hematocrit: 49.8 % (ref 38.0–52.0)
Hemoglobin: 16.5 g/dL (ref 13.0–17.3)
Lymphocytes Absolute: 0.7 10*3/uL — ABNORMAL LOW (ref 1.0–3.2)
Lymphocytes: 17.5 % (ref 15.0–45.0)
MCH: 31 pg (ref 27.0–34.5)
MCHC: 33.1 g/dL (ref 30.0–36.0)
MCV: 93.6 fL (ref 84.0–100.0)
MPV: 6.1 fL — ABNORMAL LOW (ref 7.0–12.2)
Monocytes %: 7.3 10*3/uL (ref 4.0–12.0)
Monocytes Absolute: 0.3 10*3/uL (ref 0.3–1.0)
Neutrophils %: 69.6 % (ref 42.0–74.0)
Neutrophils Absolute: 2.6 10*3/uL (ref 1.6–7.3)
Platelets: 173 10*3/uL (ref 140–440)
RBC: 5.32 10*3/uL (ref 4.00–5.60)
RDW: 12.3 % (ref 10.0–17.0)
WBC: 3.8 10*3/uL (ref 3.8–10.6)

## 2023-08-18 LAB — FERRITIN: Ferritin: 37.3 ng/mL (ref 30.0–400.0)

## 2023-08-27 ENCOUNTER — Encounter: Primary: Hematology & Oncology

## 2023-09-03 ENCOUNTER — Inpatient Hospital Stay: Admit: 2023-09-03 | Payer: BLUE CROSS/BLUE SHIELD | Primary: Hematology & Oncology

## 2023-09-03 ENCOUNTER — Encounter

## 2023-09-03 ENCOUNTER — Other Ambulatory Visit: Admit: 2023-09-03 | Discharge: 2023-09-03 | Payer: BLUE CROSS/BLUE SHIELD | Primary: Hematology & Oncology

## 2023-09-03 ENCOUNTER — Inpatient Hospital Stay: Admit: 2023-09-03 | Discharge: 2023-09-03 | Payer: BLUE CROSS/BLUE SHIELD | Primary: Hematology & Oncology

## 2023-09-03 ENCOUNTER — Ambulatory Visit
Admit: 2023-09-03 | Discharge: 2023-09-03 | Payer: BLUE CROSS/BLUE SHIELD | Attending: Nurse Practitioner | Primary: Hematology & Oncology

## 2023-09-03 VITALS — BP 115/74

## 2023-09-03 VITALS — BP 137/90 | HR 64 | Temp 98.10000°F | Wt 221.6 lb

## 2023-09-03 DIAGNOSIS — E611 Iron deficiency: Secondary | ICD-10-CM

## 2023-09-03 DIAGNOSIS — D751 Secondary polycythemia: Secondary | ICD-10-CM

## 2023-09-03 DIAGNOSIS — D595 Paroxysmal nocturnal hemoglobinuria [Marchiafava-Micheli]: Secondary | ICD-10-CM

## 2023-09-03 LAB — CBC WITH AUTO DIFFERENTIAL
Basophils %: 0.8 % (ref 0.0–2.0)
Basophils Absolute: 0 10*3/uL (ref 0–2)
Eosinophils %: 3.9 % (ref 0.0–7.0)
Eosinophils Absolute: 0.2 10*3/uL (ref 0.0–0.5)
Hematocrit: 50.4 % (ref 38.0–52.0)
Hemoglobin: 16.5 g/dL (ref 13.0–17.3)
Lymphocytes Absolute: 0.9 10*3/uL — ABNORMAL LOW (ref 1.0–3.2)
Lymphocytes: 18.1 % (ref 15.0–45.0)
MCH: 30 pg (ref 27.0–34.5)
MCHC: 32.8 g/dL (ref 30.0–36.0)
MCV: 91.6 fL (ref 84.0–100.0)
MPV: 6.5 fL — ABNORMAL LOW (ref 7.0–12.2)
Monocytes %: 7.6 10*3/uL (ref 4.0–12.0)
Monocytes Absolute: 0.4 10*3/uL (ref 0.3–1.0)
Neutrophils %: 69.6 % (ref 42.0–74.0)
Neutrophils Absolute: 3.5 10*3/uL (ref 1.6–7.3)
Platelets: 170 10*3/uL (ref 140–440)
RBC: 5.5 10*3/uL (ref 4.00–5.60)
RDW: 12 % (ref 10.0–17.0)
WBC: 5 10*3/uL (ref 3.8–10.6)

## 2023-09-03 LAB — COMPREHENSIVE METABOLIC PANEL
ALT: 27 U/L (ref 0–42)
AST: 33 U/L (ref 0–46)
Albumin/Globulin Ratio: 1.77 (ref 1.00–2.70)
Albumin: 4.6 g/dL (ref 3.5–5.2)
Alk Phosphatase: 49 U/L (ref 40–130)
Anion Gap: 9 mmol/L (ref 2–17)
BUN: 33 mg/dL — ABNORMAL HIGH (ref 6–20)
CO2: 28 mmol/L (ref 22–29)
Calcium: 9.4 mg/dL (ref 8.6–10.0)
Chloride: 102 mmol/L (ref 98–107)
Creatinine: 1.5 mg/dL — ABNORMAL HIGH (ref 0.7–1.3)
Est, Glom Filt Rate: 56 mL/min/1.73mÂ² — ABNORMAL LOW (ref >=60)
Globulin: 2.6 g/dL (ref 1.9–4.4)
Glucose: 96 mg/dL (ref 70–99)
Potassium: 3.9 mmol/L (ref 3.5–5.3)
Sodium: 139 mmol/L (ref 135–145)
Total Bilirubin: 0.67 mg/dL (ref 0.00–1.20)
Total Protein: 7.2 g/dL (ref 6.4–8.3)

## 2023-09-03 LAB — FERRITIN: Ferritin: 33.4 ng/mL (ref 30.0–400.0)

## 2023-09-03 LAB — LACTATE DEHYDROGENASE: LD: 228 U/L — ABNORMAL HIGH (ref 135–225)

## 2023-09-03 NOTE — Progress Notes (Signed)
 Patient: Kevin Richards  DOB: Mar 09, 1966 (58 y.o.)  Date: 09/03/2023          CHIEF COMPLAINT:  Chief Complaint   Patient presents with    Follow-up    Discuss Labs        HISTORY OF PRESENT ILLNESS:     He returns for follow-up and management of erythrocytosis and history of PNH.  He continues on AndroGel  for hypogonadism managed by endocrinologist.  His hematocrit is 50% today.  He have therapeutic phlebotomy.  He continues oral iron supplementation 3 times a week.  We discussed several issues today.  He has been having rash with itching which she attributes to dairy allergy.  He has since cut out all dairy products and rash has resolved.  He was having few days of left ankle swelling following being out in the boat.  At this time he has no extremity ankle or pedal edema.  He had no presentation of calf pain and has no history of thrombosis.  He has been reviewing his labs and voices concern regarding his creatinine historically his creatinine has been ranging 1.4-1.5 for the past 4 years.  He questions if creatinine could be elevated because of his high protein diet and use of creatine.           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    HOME MEDICATIONS:  Current Outpatient Medications   Medication Sig Dispense Refill    Testosterone  (ANDROGEL ) 20.25 MG/ACT (1.62%) GEL gel Testosterone  gel: Apply 7 pump actuations topically on skin per day, equivalent to 142 mg of testosterone  (8.75 g of gel) daily.  Dispense 90-day supply with 1 refill.  NDC [phone] 900 g 1    triamcinolone  (KENALOG ) 0.025 % ointment Apply topically on site of rash up to one time daily 1 each 2    Syringe, Disposable, 1 ML MISC Use to inject testosterone  Intramuscularly G25 1 ml syringe. 1 each 3     No current facility-administered medications for this visit.         ALLERGIES:  Gadolinium, Iodides, and Iodinated contrast media    SOCIAL HISTORY:  Social History     Socioeconomic History    Marital status: Married   Tobacco Use    Smoking status: Never    Smokeless tobacco: Never   Vaping Use    Vaping status: Never Used   Substance and Sexual Activity    Alcohol use: Never    Drug use: Never     Social Drivers of Psychologist, prison and probation services Strain: Low Risk  (10/01/2021)    Received from [facility], [facility]    Overall Financial Resource Strain (CARDIA)     Difficulty of Paying Living Expenses: Not hard at all   Food Insecurity: No Food Insecurity (10/01/2021)    Received from [facility], [facility]    Hunger Vital Sign     Worried About Running Out of Food in the Last Year: Never true     Ran Out of Food in the Last Year: Never true   Transportation Needs: No Transportation Needs (10/01/2021)    Received from [facility], [facility]    [facility] - Transportation     Lack of Transportation (Medical): No  Lack of Transportation (Non-Medical): No   Physical Activity: Sufficiently Active (10/01/2021)    Received from [facility], [facility]    Exercise Vital Sign     Days of Exercise per Week: 6 days     Minutes of Exercise per Session: 40 min   Stress: No Stress Concern Present (10/01/2021)    Received from [facility], [facility] of Occupational Health - Occupational Stress Questionnaire     Feeling of Stress : Only a little   Social Connections: Moderately Isolated (10/01/2021)    Received from [facility], [facility]    Social Connection and Isolation Panel [NHANES]     Frequency of Communication with Friends and Family: More than three times a week     Frequency of Social Gatherings with Friends and Family: Once a week     Attends Religious Services: Never     Database administrator or Organizations: No     Attends Banker Meetings: Never     Marital Status: Married   Catering manager Violence: Not At Risk  (10/01/2021)    Received from [employer] [facility], [facility]    Humiliation, Afraid, Rape, and Kick questionnaire     Fear of Current or Ex-Partner: No     Emotionally Abused: No     Physically Abused: No     Sexually Abused: No   Housing Stability: Low Risk  (10/01/2021)    Received from [facility], [facility]    Housing Stability Vital Sign     Unable to Pay for Housing in the Last Year: No     Number of Places Lived in the Last Year: 1     Unstable Housing in the Last Year: No        REVIEW OF SYSTEMS:    Constitutional:  Negative for fever.   HENT:  Negative for mouth sores and trouble swallowing.    Respiratory:  Negative for cough, chest tightness and shortness of breath.    Cardiovascular:  Negative for chest pain and leg swelling.   Gastrointestinal:  Negative for abdominal distention, abdominal pain, blood in stool, constipation, diarrhea, nausea and vomiting.   Genitourinary:  Negative for dysuria.   Skin:  Negative for color change. Rash (dermatitis rash improving) cherry angiomas to lower back   Neurological:  Negative for dizziness, speech difficulty and headaches.   Hematological:  Negative for adenopathy.   Psychiatric/Behavioral:  Negative for agitation and confusion.       VITAL SIGNS:  BP (!) 137/90   Pulse 64   Temp 98.1 F (36.7 C)   Wt 100.5 kg (221 lb 9.6 oz)   SpO2 98%   BMI 30.05 kg/m     PHYSICAL EXAM:    Constitutional: No acute distress  CV: RRR, normal S1, S2  Lungs: Clear to auscultation bilaterally, breathing comfortably  Abdomen: Soft, nontender, active bowel sounds  Neuro: no focal deficits, alert and oriented x3  Ext: no edema      LABORATORY DATA:        Recent Results (from the past 6 weeks)   CBC With Auto Differential    Collection Time: 08/18/23 11:26 AM   Result Value Ref Range    WBC 3.8 3.8 - 10.6 x10e3/mcL    RBC 5.32 4.00 - 5.60 x10e3/mcL    Hemoglobin 16.5 13.0 - 17.3 g/dL    Hematocrit 64.3 32.9 - 52.0 %    MCV 93.6 84.0 - 100.0  fL    MCH 31.0 27.0 - 34.5 pg     MCHC 33.1 30.0 - 36.0 g/dL    RDW 04.5 40.9 - 81.1 %    Platelets 173 140 - 440 x10e3/mcL    MPV 6.1 (L) 7.0 - 12.2 fL    Neutrophils % 69.6 42.0 - 74.0 %    Lymphocytes 17.5 15.0 - 45.0 %    Monocytes % 7.3 4.0 - 12.0 x10e3/mcL    Eosinophils % 4.7 0.0 - 7.0 %    Basophils % 0.9 0.0 - 2.0 %    Neutrophils Absolute 2.6 1.6 - 7.3 x10e3/mcL    Lymphocytes Absolute 0.7 (L) 1.0 - 3.2 x10e3/mcL    Monocytes Absolute 0.3 0.3 - 1.0 x10e3/mcL    Eosinophils Absolute 0.2 0.0 - 0.5 x10e3/mcL    Basophils Absolute 0 0 - 2 x10e3/mcL   Ferritin    Collection Time: 08/18/23 11:26 AM   Result Value Ref Range    Ferritin 37.3 30.0 - 400.0 ng/mL   Comprehensive Metabolic Panel    Collection Time: 09/03/23  3:03 PM   Result Value Ref Range    Sodium 139 135 - 145 mmol/L    Potassium 3.9 3.5 - 5.3 mmol/L    Chloride 102 98 - 107 mmol/L    CO2 28 22 - 29 mmol/L    Glucose 96 70 - 99 mg/dL    BUN 33 (H) 6 - 20 mg/dL    Creatinine 1.5 (H) 0.7 - 1.3 mg/dL    Anion Gap 9 2 - 17 mmol/L    Calcium 9.4 8.6 - 10.0 mg/dL    Total Protein 7.2 6.4 - 8.3 g/dL    Albumin 4.6 3.5 - 5.2 g/dL    Globulin 2.6 1.9 - 4.4 g/dL    Albumin/Globulin Ratio 1.77 1.00 - 2.70    Total Bilirubin 0.67 0.00 - 1.20 mg/dL    Alk Phosphatase 49 40 - 130 unit/L    AST 33 0 - 46 unit/L    ALT 27 0 - 42 unit/L    Est, Glom Filt Rate 56 (L) >=60 mL/min/1.26m   CBC with Auto Differential    Collection Time: 09/03/23  3:03 PM   Result Value Ref Range    WBC 5.0 3.8 - 10.6 x10e3/mcL    RBC 5.50 4.00 - 5.60 x10e3/mcL    Hemoglobin 16.5 13.0 - 17.3 g/dL    Hematocrit 91.4 78.2 - 52.0 %    MCV 91.6 84.0 - 100.0 fL    MCH 30.0 27.0 - 34.5 pg    MCHC 32.8 30.0 - 36.0 g/dL    RDW 95.6 21.3 - 08.6 %    Platelets 170 140 - 440 x10e3/mcL    MPV 6.5 (L) 7.0 - 12.2 fL    Neutrophils % 69.6 42.0 - 74.0 %    Lymphocytes 18.1 15.0 - 45.0 %    Monocytes % 7.6 4.0 - 12.0 x10e3/mcL    Eosinophils % 3.9 0.0 - 7.0 %    Basophils % 0.8 0.0 - 2.0 %    Neutrophils Absolute 3.5 1.6 - 7.3  x10e3/mcL    Lymphocytes Absolute 0.9 (L) 1.0 - 3.2 x10e3/mcL    Monocytes Absolute 0.4 0.3 - 1.0 x10e3/mcL    Eosinophils Absolute 0.2 0.0 - 0.5 x10e3/mcL    Basophils Absolute 0 0 - 2 x10e3/mcL   Lactate Dehydrogenase    Collection Time: 09/03/23  3:03 PM   Result Value Ref Range    LD  228 (H) 135 - 225 unit/L         Impression & Plan:      1. PNH (paroxysmal nocturnal hemoglobinuria) (HCC)    2. Iron deficiency    3. Erythrocytosis      Hematocrit has improved with more frequent phlebotomies.  Her hematocrit is 50% today we will proceed with therapeutic phlebotomy.  He will have his labs checked in [facility] in 2 to 3 weeks and contact us  if phlebotomy is needed.  He continues androgen supplementation under the direction of endocrinologist.  At this time there is no concern for thrombosis.  He will notify with any issues going forward.  Discussed his creatinine has been very stable over the past 4 to 5 years and further workup is not needed at this time.     He will return for reevaluation in 3 months  Keslee Harrington, NP-C  Patient seen jointly with Dr. Washington Hacker

## 2023-09-04 LAB — SOLUBLE TRANSFERRIN RECEPTOR: Soluble Transferrin Recept: 28.4 nmol/L — ABNORMAL HIGH (ref 12.2–27.3)

## 2023-09-24 ENCOUNTER — Inpatient Hospital Stay: Admit: 2023-09-24 | Discharge: 2023-09-24 | Payer: BLUE CROSS/BLUE SHIELD | Primary: Hematology & Oncology

## 2023-09-24 ENCOUNTER — Encounter

## 2023-09-24 ENCOUNTER — Other Ambulatory Visit: Admit: 2023-09-24 | Discharge: 2023-09-24 | Payer: BLUE CROSS/BLUE SHIELD | Primary: Hematology & Oncology

## 2023-09-24 VITALS — BP 143/83 | HR 66 | Temp 98.50000°F | Resp 16

## 2023-09-24 DIAGNOSIS — D751 Secondary polycythemia: Secondary | ICD-10-CM

## 2023-09-24 LAB — CBC WITH AUTO DIFFERENTIAL
Basophils %: 0.9 % (ref 0.0–2.0)
Basophils Absolute: 0 10*3/uL (ref 0–2)
Eosinophils %: 4 % (ref 0.0–7.0)
Eosinophils Absolute: 0.2 10*3/uL (ref 0.0–0.5)
Hematocrit: 49.4 % (ref 38.0–52.0)
Hemoglobin: 16.4 g/dL (ref 13.0–17.3)
Lymphocytes Absolute: 0.9 10*3/uL — ABNORMAL LOW (ref 1.0–3.2)
Lymphocytes: 19.4 % (ref 15.0–45.0)
MCH: 30.4 pg (ref 27.0–34.5)
MCHC: 33.3 g/dL (ref 30.0–36.0)
MCV: 91.2 fL (ref 84.0–100.0)
MPV: 6.5 fL — ABNORMAL LOW (ref 7.0–12.2)
Monocytes %: 8.8 10*3/uL (ref 4.0–12.0)
Monocytes Absolute: 0.4 10*3/uL (ref 0.3–1.0)
Neutrophils %: 66.9 % (ref 42.0–74.0)
Neutrophils Absolute: 3 10*3/uL (ref 1.6–7.3)
Platelets: 180 10*3/uL (ref 140–440)
RBC: 5.42 10*3/uL (ref 4.00–5.60)
RDW: 12.1 % (ref 10.0–17.0)
WBC: 4.4 10*3/uL (ref 3.8–10.6)

## 2023-09-24 LAB — FERRITIN: Ferritin: 40.2 ng/mL (ref 30.0–400.0)

## 2023-10-22 ENCOUNTER — Inpatient Hospital Stay: Admit: 2023-10-22 | Discharge: 2023-10-22 | Payer: BLUE CROSS/BLUE SHIELD | Primary: Hematology & Oncology

## 2023-10-22 DIAGNOSIS — D751 Secondary polycythemia: Principal | ICD-10-CM

## 2023-11-12 ENCOUNTER — Encounter

## 2023-11-18 ENCOUNTER — Other Ambulatory Visit: Admit: 2023-11-18 | Discharge: 2023-11-18 | Payer: BLUE CROSS/BLUE SHIELD | Primary: Hematology & Oncology

## 2023-11-18 ENCOUNTER — Encounter

## 2023-11-18 ENCOUNTER — Inpatient Hospital Stay: Admit: 2023-11-18 | Discharge: 2023-11-18 | Payer: BLUE CROSS/BLUE SHIELD | Primary: Hematology & Oncology

## 2023-11-18 VITALS — BP 123/85 | HR 84 | Temp 98.20000°F | Resp 16

## 2023-11-18 DIAGNOSIS — D751 Secondary polycythemia: Principal | ICD-10-CM

## 2023-11-18 LAB — CBC WITH AUTO DIFFERENTIAL
Basophils %: 0.6 % (ref 0.0–2.0)
Basophils Absolute: 0 x10e3/mcL (ref 0.0–0.2)
Eosinophils %: 3.6 % (ref 0.0–7.0)
Eosinophils Absolute: 0.1 x10e3/mcL (ref 0.0–0.5)
Hematocrit: 52.6 % — ABNORMAL HIGH (ref 38.0–52.0)
Hemoglobin: 16.6 g/dL (ref 13.0–17.3)
Immature Grans (Abs): 0 x10e3/mcL (ref 0.00–0.06)
Immature Granulocytes %: 0 % (ref 0.0–0.6)
Lymphocytes Absolute: 0.7 x10e3/mcL — ABNORMAL LOW (ref 1.0–3.2)
Lymphocytes: 19.7 % (ref 15.0–45.0)
MCH: 30.3 pg (ref 27.0–34.5)
MCHC: 31.6 g/dL (ref 30.0–36.0)
MCV: 96 fL (ref 84.0–100.0)
MPV: 9.6 fL (ref 7.0–12.2)
Monocytes %: 6.9 % (ref 4.0–12.0)
Monocytes Absolute: 0.3 x10e3/mcL (ref 0.3–1.0)
Neutrophils %: 69.2 % (ref 42.0–74.0)
Neutrophils Absolute: 2.5 x10e3/mcL (ref 1.6–7.3)
Platelets: 126 x10e3/mcL — ABNORMAL LOW (ref 140–440)
RBC: 5.48 x10e6/mcL (ref 4.00–5.60)
RDW: 13.3 % (ref 10.0–17.0)
WBC: 3.6 x10e3/mcL — ABNORMAL LOW (ref 3.8–10.6)

## 2023-11-18 LAB — FERRITIN: Ferritin: 49.3 ng/mL (ref 30.0–400.0)

## 2023-12-03 ENCOUNTER — Encounter

## 2023-12-09 ENCOUNTER — Encounter

## 2023-12-10 ENCOUNTER — Ambulatory Visit
Admit: 2023-12-10 | Discharge: 2023-12-10 | Payer: BLUE CROSS/BLUE SHIELD | Attending: Medical | Primary: Hematology & Oncology

## 2023-12-10 ENCOUNTER — Inpatient Hospital Stay: Admit: 2023-12-10 | Discharge: 2023-12-10 | Payer: BLUE CROSS/BLUE SHIELD | Primary: Hematology & Oncology

## 2023-12-10 ENCOUNTER — Encounter

## 2023-12-10 ENCOUNTER — Other Ambulatory Visit: Admit: 2023-12-10 | Discharge: 2023-12-10 | Payer: BLUE CROSS/BLUE SHIELD | Primary: Hematology & Oncology

## 2023-12-10 VITALS — BP 124/77 | HR 69 | Temp 98.40000°F | Wt 216.8 lb

## 2023-12-10 VITALS — BP 117/80 | HR 83

## 2023-12-10 DIAGNOSIS — D751 Secondary polycythemia: Principal | ICD-10-CM

## 2023-12-10 DIAGNOSIS — E611 Iron deficiency: Principal | ICD-10-CM

## 2023-12-10 LAB — CBC WITH AUTO DIFFERENTIAL
Basophils %: 0.5 % (ref 0.0–2.0)
Basophils Absolute: 0 x10e3/mcL (ref 0.0–0.2)
Eosinophils %: 3.2 % (ref 0.0–7.0)
Eosinophils Absolute: 0.1 x10e3/mcL (ref 0.0–0.5)
Hematocrit: 53.1 % — ABNORMAL HIGH (ref 38.0–52.0)
Hemoglobin: 17.1 g/dL (ref 13.0–17.3)
Immature Grans (Abs): 0 x10e3/mcL (ref 0.00–0.06)
Immature Granulocytes %: 0 % (ref 0.0–0.6)
Lymphocytes Absolute: 0.7 x10e3/mcL — ABNORMAL LOW (ref 1.0–3.2)
Lymphocytes: 16.6 % (ref 15.0–45.0)
MCH: 31 pg (ref 27.0–34.5)
MCHC: 32.2 g/dL (ref 30.0–36.0)
MCV: 96.4 fL (ref 84.0–100.0)
MPV: 9.3 fL (ref 7.0–12.2)
Monocytes %: 7.1 % (ref 4.0–12.0)
Monocytes Absolute: 0.3 x10e3/mcL (ref 0.3–1.0)
Neutrophils %: 72.6 % (ref 42.0–74.0)
Neutrophils Absolute: 3.2 x10e3/mcL (ref 1.6–7.3)
Platelets: 144 x10e3/mcL (ref 140–440)
RBC: 5.51 x10e6/mcL (ref 4.00–5.60)
RDW: 13.6 % (ref 10.0–17.0)
WBC: 4.3 x10e3/mcL (ref 3.8–10.6)

## 2023-12-10 LAB — COMPREHENSIVE METABOLIC PANEL
ALT: 26 U/L (ref 0–42)
AST: 35 U/L (ref 0–46)
Albumin/Globulin Ratio: 1.59 (ref 1.00–2.70)
Albumin: 4.6 g/dL (ref 3.5–5.2)
Alk Phosphatase: 58 U/L (ref 40–130)
Anion Gap: 11 mmol/L (ref 2–17)
BUN: 34 mg/dL — ABNORMAL HIGH (ref 6–20)
CO2: 30 mmol/L — ABNORMAL HIGH (ref 22–29)
Calcium: 9.4 mg/dL (ref 8.6–10.0)
Chloride: 99 mmol/L (ref 98–107)
Creatinine: 1.9 mg/dL — ABNORMAL HIGH (ref 0.7–1.3)
Est, Glom Filt Rate: 41 mL/min/1.73m — ABNORMAL LOW (ref >=60)
Globulin: 2.9 g/dL (ref 1.9–4.4)
Glucose: 93 mg/dL (ref 70–99)
Potassium: 4.9 mmol/L (ref 3.5–5.3)
Sodium: 140 mmol/L (ref 135–145)
Total Bilirubin: 0.49 mg/dL (ref 0.00–1.20)
Total Protein: 7.5 g/dL (ref 6.4–8.3)

## 2023-12-10 LAB — FERRITIN: Ferritin: 40.6 ng/mL (ref 30.0–400.0)

## 2023-12-10 NOTE — Progress Notes (Signed)
 Progress Note    Date:12/10/2023        Patient Name:Kevin Richards     Date of Birth:[birth year]     Age:58 y.o.    Subjective   Interval History Status: not changed.     Kevin Richards comes in today for follow up regarding his erythrocytosis and history of PNH.  He receives phlebotomy for hematocrit greater than 50.  His hematocrit is 53% today.  Oral iron supplement 3 times a week.    He continues on AndroGel  for hypogonadism managed by Dr. Clotilda.  He has follow-up with him in 2 weeks.  His creatinine is usually ranging from 1.3-1.5 for last several years however is 1.9 today.  6 months ago he went back on creatine supplementation.  He previously consumed 200+ grams protein a day but he is down to 150 g of protein a day.  This is likely having influence on his creatinine.  Oncology History    No problem history exists.       Review of Systems   Review of Systems   Constitutional:  Negative for fever.   HENT:  Negative for mouth sores and trouble swallowing.    Respiratory:  Negative for cough, chest tightness and shortness of breath.    Cardiovascular:  Negative for chest pain and leg swelling.   Gastrointestinal:  Negative for abdominal distention, abdominal pain, blood in stool, constipation, diarrhea, nausea and vomiting.   Genitourinary:  Negative for dysuria.   Skin:  Negative for color change.   Neurological:  Negative for dizziness, speech difficulty and headaches.   Hematological:  Negative for adenopathy.   Psychiatric/Behavioral:  Negative for agitation and confusion.         Medications   Scheduled Meds:   Continuous Infusions:   PRN Meds:     Past History    Past Medical History:   has a past medical history of Hypogonadism in male and Paroxysmal nocturnal hemoglobinuria (PNH) (HCC).    Social History:   reports that he has never smoked. He has never used smokeless tobacco. He reports that he does not drink alcohol and does not use drugs.     Family History: No family history on file.    Physical  Examination      Vitals:  BP 124/77 (BP Site: Right Upper Arm, Patient Position: Sitting, BP Cuff Size: Large Adult)   Pulse 69   Temp 98.4 F (36.9 C)   Wt 98.3 kg (216 lb 12.8 oz)   SpO2 97%   BMI 29.40 kg/m         No data to display                Physical Exam  Constitutional:       General: He is not in acute distress.  Cardiovascular:      Rate and Rhythm: Normal rate and regular rhythm.      Heart sounds: No murmur heard.  Pulmonary:      Effort: Pulmonary effort is normal.      Breath sounds: No wheezing, rhonchi or rales.   Abdominal:      General: Bowel sounds are normal. There is no distension.      Tenderness: There is no abdominal tenderness.   Musculoskeletal:         General: No swelling.   Lymphadenopathy:      Cervical: No cervical adenopathy.   Neurological:      General: No focal deficit present.  Mental Status: He is alert and oriented to person, place, and time.          Labs/Imaging/Diagnostics   Labs:  CBC:  Recent Labs     12/10/23  1406   WBC 4.3   RBC 5.51   HGB 17.1   HCT 53.1*   MCV 96.4   RDW 13.6   PLT 144     CHEMISTRIES:  Recent Labs     12/10/23  1406   NA 140   K 4.9   CL 99   CO2 30*   BUN 34*   CREATININE 1.9*   GLUCOSE 93     PT/INR:No results for input(s): PROTIME, INR in the last 72 hours.  APTT:No results for input(s): APTT in the last 72 hours.  LIVER PROFILE:  Recent Labs     12/10/23  1406   AST 35   ALT 26   BILITOT 0.49   ALKPHOS 58       Imaging Last 24 Hours:  No results found.      Assessment        1. Erythrocytosis    2. Iron deficiency [E61.1]    3. Elevated serum creatinine         Plan:        I was able phlebotomy today and continue to have it checked in [facility] and contact us  if his hematocrit is greater than 50%.  He will see Dr. Clotilda for endocrinology in 2 weeks.  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  His past history of  taking creatine noting a improvement in his serum creatinine when he stops it indicates that this is the likely cause.  He had a nephrologist in [facility].  Our plan is to hold his creatine supplementation and recheck this in 6 weeks.  Will proceed with nephrology referral but has not improved.  If he decides to get back on creatine I would like this to be under the guidance of a nephrologist.    Electronically signed by RONAL JACKQULINE SNIDE, PA-C on 12/10/23 at 2:32 PM EDT

## 2023-12-19 LAB — CBC/DIFF AMBIGUOUS DEFAULT
Basophils %: 1 %
Basophils Absolute: 0 x10E3/uL (ref 0.0–0.2)
Eosinophils %: 5 %
Eosinophils Absolute: 0.2 x10E3/uL (ref 0.0–0.4)
Hematocrit: 46.7 % (ref 37.5–51.0)
Hemoglobin: 15.3 g/dL (ref 13.0–17.7)
Immature Grans (Abs): 0 x10E3/uL (ref 0.0–0.1)
Immature Granulocytes %: 0 %
Lymphocytes %: 21 %
Lymphocytes Absolute: 0.8 x10E3/uL (ref 0.7–3.1)
MCH: 31.5 pg (ref 26.6–33.0)
MCHC: 32.8 g/dL (ref 31.5–35.7)
MCV: 96 fL (ref 79–97)
Monocytes %: 6 %
Monocytes Absolute: 0.2 x10E3/uL (ref 0.1–0.9)
Neutrophils %: 67 %
Neutrophils Absolute: 2.6 x10E3/uL (ref 1.4–7.0)
Platelets: 151 x10E3/uL (ref 150–450)
RBC: 4.86 x10E6/uL (ref 4.14–5.80)
RDW: 13.9 % (ref 11.6–15.4)
WBC: 3.9 x10E3/uL (ref 3.4–10.8)

## 2023-12-19 LAB — IRON AND TIBC
Iron % Saturation: 21 % (ref 15–55)
Iron: 64 ug/dL (ref 38–169)
TIBC: 301 ug/dL (ref 250–450)
UIBC: 237 ug/dL (ref 111–343)

## 2023-12-19 LAB — FERRITIN: Ferritin: 43 ng/mL (ref 30–400)

## 2023-12-19 LAB — ESTRADIOL: Estradiol: 28.6 pg/mL (ref 7.6–42.6)

## 2023-12-19 LAB — PSA, DIAGNOSTIC: PSA: 3.5 ng/mL (ref 0.0–4.0)

## 2023-12-21 LAB — TESTOSTERONE, FREE/TOTAL EQUILIBRIUM ULTRAFILTRATION
Testosterone % Free: 2.08 % (ref 1.50–4.20)
Testosterone, Free: 10.36 ng/dL (ref 5.00–21.00)
Testosterone: 498 ng/dL (ref 264–916)

## 2023-12-26 ENCOUNTER — Telehealth
Admit: 2023-12-26 | Discharge: 2023-12-26 | Payer: BLUE CROSS/BLUE SHIELD | Attending: "Endocrinology | Primary: Hematology & Oncology

## 2023-12-26 DIAGNOSIS — E291 Testicular hypofunction: Principal | ICD-10-CM

## 2023-12-26 MED ORDER — TESTOSTERONE 20.25 MG/ACT (1.62%) TD GEL
20.25 | TRANSDERMAL | 1 refills | Status: AC
Start: 2023-12-26 — End: 2025-04-12

## 2023-12-26 NOTE — Progress Notes (Signed)
 [facility]  Tel: [phone]  Fax: [phone]       Endocrinology Note        Chief Complaint   Patient presents with    Follow-up        HPI:     12/26/23   This is a synchronous interactive virtual visit done by video and audio components from provider office to patient home. Patient consent was obtained.     History of Present Illness  The patient is a 58 year old male who is seen in a virtual follow-up visit for evaluation and management of hypogonadism.    He reports no significant changes in his condition since the last consultation. His current treatment regimen includes the use of testosterone  gel, administered through seven pump actuations. He does not require any additional syringes at this time.    He has been on creatine for approximately three weeks, which he believes may have artificially elevated his creatinine levels. Consequently, he has discontinued its use.         Past medical, social, family history and allergies:     Reviewed and documented in respective sections of the chart.        ROS:     Other than what was mentioned in HPI, a thorough 11 point Review of systems was done and was negative.      PE:     Vital Signs:        12/26/2023 12/23/2023 12/10/2023 11/18/2023 10/22/2023 09/24/2023 09/03/2023   Combined Vitals   Blood Pressure   117/80    112/74    124/77 123/85    126/81    120/70 107/75    103/62    118/75 143/83 115/74    120/69    137/90   Pulse   83    73    69 84    70    74 83    77 66 64   Temp   98.4 F (36.9 C) 98.2 F (36.8 C) 98.3 F (36.8 C) 98.5 F (36.9 C) 98.1 F (36.7 C)   Weight   216 lb 12.8 oz    221 lb 9.6 oz   Pt-Reported Weight 205 205        SpO2   97 % 96 % 98 %  98 %       Multiple values from one day are sorted in reverse-chronological order       There is no height or weight on file to calculate BMI.      General: The patient is in no acute distress  HEENT: PERRLA, EOMI  Neck: Supple, no JVD  Thyroid: no visible goiter  Abdomen: Relaxed,  nondistended  Extremities: No cyanosis, clubbing or edema  Neurological: No lateralization      Labs:       Review of endocrinology test results:       TSH, 3rd Generation (mcIU/mL)   Date Value   09/22/2020 2.400   05/24/2020 1.580     TSH ((uIU/mL))   Date Value   10/30/2017 1.940     Glucose   Date Value   12/10/2023 93 mg/dL   93/95/7974 96 mg/dL   95/90/7974 87 mg/dL   95/80/7978 77 (mg/dL)     Triglycerides ((mg/dL))   Date Value   95/80/7978 107     Creatinine (mg/dL)   Date Value   90/89/7974 1.9 (H)   09/03/2023 1.5 (H)   07/09/2023 1.4 (H)  ASSESSMENT AND PLAN:     1. Hypogonadism in male  -     Testosterone  (ANDROGEL ) 20.25 MG/ACT (1.62%) GEL gel; Testosterone  gel: Apply 7 pump actuations topically on skin per day, equivalent to 142 mg of testosterone  (8.75 g of gel) daily.  Dispense 90-day supply with 1 refill.  NDC [phone], Disp-900 g, R-1Normal  -     PSA Screening; Future  -     CBC with Auto Differential; Future  -     Estradiol; Future  -     Testosterone ; Future  -     Testosterone , Free/Tot Equilib; Future  -     Comprehensive Metabolic Panel; Future  2. Testicular hypofunction  3. Alopecia  4. Paroxysmal nocturnal hemoglobinuria (PNH) (HCC)  5. Iron deficiency  6. Skin irritation due to topical agent  7. Skin rash  8. Other iron deficiency anemia  -     Ferritin; Future  -     Iron and TIBC; Future  9. PNH (paroxysmal nocturnal hemoglobinuria) (HCC)  10. Stage 3a chronic kidney disease (HCC)  -     PSA Screening; Future  -     CBC with Auto Differential; Future  -     Estradiol; Future  -     Testosterone ; Future  -     Testosterone , Free/Tot Equilib; Future  -     Comprehensive Metabolic Panel; Future  -     Ferritin; Future  -     Iron and TIBC; Future      Assessment & Plan  1. Hypogonadism:  - Currently using testosterone  gel with seven pump actuations. No recent changes in symptoms.  - Testosterone  gel, seven pump actuations. No prescription for syringes needed.  - Prescription  for testosterone  gel sent to pharmacy. Follow-up appointment in six months.    2. Elevated creatinine:  - Creatinine levels increased from 1.5 in 08/2023 to 1.9 recently. Patient had been taking creatine for about three weeks, which can artificially raise creatinine levels. He has since stopped taking creatine.    Follow-up: Follow-up appointment scheduled for six months from now.         Attestation        The patient (or guardian, if applicable) and other individuals in attendance with the patient were advised that Artificial Intelligence will be utilized during this visit to record, process the conversation to generate a clinical note, and support improvement of the AI technology. The patient (or guardian, if applicable) and other individuals in attendance at the appointment consented to the use of AI, including the recording.         An electronic signature was used to authenticate this note.    --EVERLENA JENEANE PACKER, MD

## 2023-12-31 ENCOUNTER — Encounter

## 2024-01-07 ENCOUNTER — Encounter: Primary: Hematology & Oncology

## 2024-01-29 ENCOUNTER — Encounter

## 2024-01-29 ENCOUNTER — Other Ambulatory Visit: Admit: 2024-01-29 | Discharge: 2024-01-29 | Payer: BLUE CROSS/BLUE SHIELD | Primary: Hematology & Oncology

## 2024-01-29 ENCOUNTER — Inpatient Hospital Stay: Admit: 2024-01-29 | Discharge: 2024-01-29 | Payer: BLUE CROSS/BLUE SHIELD | Primary: Hematology & Oncology

## 2024-01-29 VITALS — BP 115/75 | HR 67 | Resp 16

## 2024-01-29 DIAGNOSIS — D751 Secondary polycythemia: Secondary | ICD-10-CM

## 2024-01-29 DIAGNOSIS — R7989 Other specified abnormal findings of blood chemistry: Secondary | ICD-10-CM

## 2024-01-29 LAB — CBC WITH AUTO DIFFERENTIAL
Basophils %: 0.6 % (ref 0.0–2.0)
Basophils Absolute: 0 x10e3/mcL (ref 0–2)
Eosinophils %: 3.4 % (ref 0.0–7.0)
Eosinophils Absolute: 0.1 x10e3/mcL (ref 0.0–0.5)
Hematocrit: 48.4 % (ref 38.0–52.0)
Hemoglobin: 16.3 g/dL (ref 13.0–17.3)
Lymphocytes Absolute: 0.7 x10e3/mcL — ABNORMAL LOW (ref 1.0–3.2)
Lymphocytes: 22.8 % (ref 15.0–45.0)
MCH: 32.1 pg (ref 27.0–34.5)
MCHC: 33.7 g/dL (ref 30.0–36.0)
MCV: 95.3 fL (ref 84.0–100.0)
MPV: 9.6 fL (ref 7.0–12.2)
Monocytes %: 8.1 % (ref 4.0–12.0)
Monocytes Absolute: 0.3 x10e3/mcL (ref 0.3–1.0)
Neutrophils %: 64.8 % (ref 42.0–74.0)
Neutrophils Absolute: 2.1 x10e3/mcL (ref 1.6–7.3)
Platelets: 161 x10e3/mcL (ref 140–440)
RBC: 5.08 x10e3/mcL (ref 4.00–5.60)
RDW: 13.2 % (ref 10.0–17.0)
WBC: 3.2 x10e3/mcL — ABNORMAL LOW (ref 3.8–10.6)

## 2024-01-29 LAB — BASIC METABOLIC PANEL
Anion Gap: 6 mmol/L (ref 2–17)
BUN: 21 mg/dL — ABNORMAL HIGH (ref 6–20)
CO2: 30 mmol/L — ABNORMAL HIGH (ref 22–29)
Calcium: 9.3 mg/dL (ref 8.6–10.0)
Chloride: 104 mmol/L (ref 98–107)
Creatinine: 1.3 mg/dL (ref 0.7–1.3)
Est, Glom Filt Rate: 66 mL/min/1.73mÂ² (ref >=60)
Glucose: 90 mg/dL (ref 70–99)
Potassium: 4.2 mmol/L (ref 3.5–5.3)
Sodium: 140 mmol/L (ref 135–145)

## 2024-02-03 NOTE — Telephone Encounter (Signed)
"  It looks like my insurance company is making changes once again and only allowing for 2 bottles per month when it comes to refills.     Is there a better option or can you work around this as noted in the attached letter?   Attachments   [employer] [email or web address]    Please review attachment   "

## 2024-02-03 NOTE — Telephone Encounter (Signed)
 TE sent to Dr. Clotilda

## 2024-02-08 ENCOUNTER — Encounter

## 2024-02-09 NOTE — Telephone Encounter (Signed)
"  I spoke with patient and advised   "

## 2024-02-17 ENCOUNTER — Telehealth
Admit: 2024-02-17 | Discharge: 2024-02-17 | Payer: BLUE CROSS/BLUE SHIELD | Attending: "Endocrinology | Primary: Hematology & Oncology

## 2024-02-17 DIAGNOSIS — E291 Testicular hypofunction: Principal | ICD-10-CM

## 2024-02-17 MED ORDER — TESTOSTERONE POWD
1 refills | 28.00000 days | Status: AC
Start: 2024-02-17 — End: 2024-08-16

## 2024-02-17 NOTE — Telephone Encounter (Signed)
"  I spoke with patient and he is scheduled   "

## 2024-02-17 NOTE — Progress Notes (Signed)
 "   [facility]  Tel: [phone]  Fax: [phone]       Endocrinology Note        Chief Complaint   Patient presents with    Follow-up        HPI:     02/17/24   This is a synchronous interactive virtual visit done by video and audio components from provider office to patient home. Patient consent was obtained.     History of Present Illness  The patient is a 58 year old male who presents via virtual visit for evaluation and management of hypogonadism and discussion of his treatment options.    He reports that his insurance provider has limited the coverage to only two bottles of gel per month, with a preference towards injectable testosterone . Concerns are expressed about the potential increase in hematocrit levels associated with injections, as he currently undergoes phlebotomy every 3 to 4 weeks due to a blood disorder. Hematocrit levels are maintained below 48.     A previous incident is recalled where a different testosterone  gel caused a rash, leading to a switch in medication. This change resulted in a reduction of out-of-pocket expenses to approximately 20 dollars every three months. He mentions being informed that the cost of the gel without insurance would be around 3000 dollars. Testosterone  injections have been previously received from his physician.         Past medical, social, family history and allergies:     Reviewed and documented in respective sections of the chart.        ROS:     Other than what was mentioned in HPI, a thorough 11 point Review of systems was done and was negative.      PE:     Vital Signs:        02/17/2024 01/29/2024 12/26/2023 12/23/2023 12/10/2023 11/18/2023 10/22/2023   Combined Vitals   Blood Pressure  115/75    140/81    117/78   117/80    112/74    124/77 123/85    126/81    120/70 107/75    103/62    118/75   Pulse  67    73    67   83    73    69 84    70    74 83    77   Temp     98.4 F (36.9 C) 98.2 F (36.8 C) 98.3 F (36.8 C)   Weight     216 lb 12.8  oz     Pt-Reported Weight 205  205 205      SpO2     97 % 96 % 98 %       Multiple values from one day are sorted in reverse-chronological order       There is no height or weight on file to calculate BMI.      General: The patient is in no acute distress  HEENT: PERRLA, EOMI  Neck: Supple, no JVD  Thyroid: no visible goiter  Abdomen: Relaxed, nondistended  Extremities: No cyanosis, clubbing or edema  Neurological: No lateralization      Labs:       Review of endocrinology test results:       TSH, 3rd Generation (mcIU/mL)   Date Value   09/22/2020 2.400   05/24/2020 1.580     TSH ((uIU/mL))   Date Value   10/30/2017 1.940     Glucose   Date  Value   01/29/2024 90 mg/dL   90/89/7974 93 mg/dL   93/95/7974 96 mg/dL   95/80/7978 77 (mg/dL)     Triglycerides ((mg/dL))   Date Value   95/80/7978 107     Creatinine (mg/dL)   Date Value   89/69/7974 1.3   12/10/2023 1.9 (H)   09/03/2023 1.5 (H)          ASSESSMENT AND PLAN:     1. Hypogonadism in male  -     Testosterone  POWD; Testosterone  Cream 20%, 200 mg/ml, use 0.75 ml per day, equivalent to 150 mg of Testosterone  transdermally daily. Dispense 67.5 ml (90 day supply with 1 refill), Disp-67.5 each, R-1Normal  2. Testicular hypofunction  3. Alopecia  4. Paroxysmal nocturnal hemoglobinuria (PNH) (HCC)  5. Iron deficiency  6. Skin irritation due to topical agent  7. Skin rash  8. Other iron deficiency anemia  9. PNH (paroxysmal nocturnal hemoglobinuria) (HCC)  10. Stage 3a chronic kidney disease (HCC)      Assessment & Plan  1. Hypogonadism:  [employer] provider has limited coverage to two bottles of gel per month, with a preference towards injectable testosterone . The patient is concerned about the potential increase in hematocrit levels due to testosterone  injections, which could necessitate more frequent phlebotomies.  - Current medication: testosterone  gel. A prescription for compounded testosterone  will be sent to [facility]. If the cost is prohibitive,  the patient is not obligated to purchase it. If these options prove ineffective, a weekly testosterone  injection regimen will be considered.  - Recommendations: Consider using GoodRx for discounted AndroGel  or explore the option of compounded testosterone  from [facility]. If switching to a shot or compounded gel, labs will need to be rechecked in a couple of months to adjust the dose as needed.  - Patient education: Explained that the spike in testosterone  levels from injections could lead to increased red blood cell formation. Any changes in the current treatment plan would require a reevaluation of laboratory results within a few months.    Follow-up: Recheck labs in a couple of months if there are any changes in the current treatment plan.         Attestation        The patient (or guardian, if applicable) and other individuals in attendance with the patient were advised that Artificial Intelligence will be utilized during this visit to record, process the conversation to generate a clinical note, and support improvement of the AI technology. The patient (or guardian, if applicable) and other individuals in attendance at the appointment consented to the use of AI, including the recording.         An electronic signature was used to authenticate this note.    --EVERLENA JENEANE PACKER, MD    "

## 2024-02-17 NOTE — Telephone Encounter (Signed)
"  I wrote a letter and printed it out.  Please fax it to his insurance and get a confirmation and scanned the confirmation into his chart and let the patient know.  "

## 2024-02-18 NOTE — Telephone Encounter (Signed)
"  Says that they cannot start the prior authorization for the androgel  pump until after the first of the year since that's when the quantity review goes into effect.   "

## 2024-02-18 NOTE — Telephone Encounter (Signed)
"  LMOM advising patient that letter was sent to Insurance.   "

## 2024-02-18 NOTE — Telephone Encounter (Signed)
"  I spoke with patient and he would like letter mailed to him as well. Patient will give the office a call back 04/2024  to assist with authorization if needed for Testosterone /Androgel . Patient expressed his thanks.   "

## 2024-02-18 NOTE — Telephone Encounter (Signed)
 Letter faxed and scanned in chart

## 2024-03-03 ENCOUNTER — Ambulatory Visit
Admit: 2024-03-03 | Discharge: 2024-03-03 | Payer: BLUE CROSS/BLUE SHIELD | Attending: Hematology & Oncology | Primary: Hematology & Oncology

## 2024-03-03 ENCOUNTER — Inpatient Hospital Stay: Admit: 2024-03-03 | Discharge: 2024-03-03 | Payer: BLUE CROSS/BLUE SHIELD | Primary: Hematology & Oncology

## 2024-03-03 ENCOUNTER — Other Ambulatory Visit: Admit: 2024-03-03 | Discharge: 2024-03-03 | Payer: BLUE CROSS/BLUE SHIELD | Primary: Hematology & Oncology

## 2024-03-03 VITALS — BP 115/79 | HR 90

## 2024-03-03 VITALS — BP 131/77 | HR 65 | Temp 98.50000°F | Ht 72.0 in | Wt 216.0 lb

## 2024-03-03 DIAGNOSIS — E611 Iron deficiency: Principal | ICD-10-CM

## 2024-03-03 DIAGNOSIS — D751 Secondary polycythemia: Principal | ICD-10-CM

## 2024-03-03 LAB — COMPREHENSIVE METABOLIC PANEL
ALT: 27 U/L (ref 0–42)
AST: 28 U/L (ref 0–46)
Albumin/Globulin Ratio: 1.59 (ref 1.00–2.70)
Albumin: 4.3 g/dL (ref 3.5–5.2)
Alk Phosphatase: 56 U/L (ref 40–130)
BUN: 27 mg/dL — ABNORMAL HIGH (ref 6–20)
CO2: 26 mmol/L (ref 22–29)
Calcium: 9.1 mg/dL (ref 8.6–10.0)
Chloride: 106 mmol/L (ref 98–107)
Creatinine: 1.3 mg/dL (ref 0.7–1.3)
Est, Glom Filt Rate: 63 mL/min/1.73mÂ² (ref >=60)
Globulin: 2.7 g/dL (ref 1.9–4.4)
Glucose: 87 mg/dL (ref 70–99)
Potassium: 4.1 mmol/L (ref 3.5–5.3)
Total Bilirubin: 0.57 mg/dL (ref 0.00–1.20)
Total Protein: 7 g/dL (ref 6.4–8.3)

## 2024-03-03 LAB — CBC WITH AUTO DIFFERENTIAL
Basophils %: 0.8 % (ref 0.0–2.0)
Basophils Absolute: 0 x10e3/mcL (ref 0–2)
Eosinophils %: 3.8 % (ref 0.0–7.0)
Eosinophils Absolute: 0.1 x10e3/mcL (ref 0.0–0.5)
Hematocrit: 50.3 % (ref 38.0–52.0)
Hemoglobin: 16.7 g/dL (ref 13.0–17.3)
Immature Grans (Abs): 0.01 x10e3/mcL (ref 0.00–0.06)
Immature Granulocytes %: 0.3 % (ref 0.0–0.6)
Lymphocytes Absolute: 0.8 x10e3/mcL — ABNORMAL LOW (ref 1.0–3.2)
Lymphocytes: 20.8 % (ref 15.0–45.0)
MCH: 31.7 pg (ref 27.0–34.5)
MCHC: 33.2 g/dL (ref 30.0–36.0)
MCV: 95.6 fL (ref 84.0–100.0)
MPV: 9.5 fL (ref 7.0–12.2)
Monocytes %: 6.6 % (ref 4.0–12.0)
Monocytes Absolute: 0.2 x10e3/mcL — ABNORMAL LOW (ref 0.3–1.0)
Neutrophils %: 67.7 % (ref 42.0–74.0)
Neutrophils Absolute: 2.5 x10e3/mcL (ref 1.6–7.3)
Platelets: 156 x10e3/mcL (ref 140–440)
RBC: 5.26 x10e3/mcL (ref 4.00–5.60)
RDW: 13.1 % (ref 10.0–17.0)
WBC: 3.7 x10e3/mcL — ABNORMAL LOW (ref 3.8–10.6)

## 2024-03-03 NOTE — Progress Notes (Signed)
 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  He is generally feeling well.  He is having problems taking oral iron.  He has had constipation and reports he has roiling in the stomach.  He denies shortness of breath or cough.  Denies any headache.  Has had no swelling.  He has had no indications of thrombosis.  He continues on phlebotomy program with phlebotomy if his hematocrit is 48.0 or higher.  His last ferritin was 43 with a soluble transferrin receptor at 28    ONCOLOGIC HISTORY:  Oncology History    No problem history exists.        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    HOME MEDICATIONS:  Current Outpatient Medications   Medication Sig Dispense Refill    Testosterone  POWD Testosterone  Cream 20%, 200 mg/ml, use 0.75 ml per day, equivalent to 150 mg of Testosterone  transdermally daily. Dispense 67.5 ml (90 day supply with 1 refill) 67.5 each 1    Testosterone  (ANDROGEL ) 20.25 MG/ACT (1.62%) GEL gel Testosterone  gel: Apply 7 pump actuations topically on skin per day, equivalent to 142 mg of testosterone  (8.75 g of gel) daily.  Dispense 90-day supply with 1 refill.  NDC [phone] 900 g 1    triamcinolone  (KENALOG ) 0.025 % ointment Apply topically on site of rash up to one time daily (Patient not taking: Reported on 02/17/2024) 1 each 2    Syringe, Disposable, 1 ML MISC Use to inject testosterone  Intramuscularly G25 1 ml syringe. 1 each 3     No current facility-administered medications for this visit.        ALLERGIES:  Gadolinium, Iodides, and  Iodinated contrast media    SOCIAL HISTORY:  Social History     Socioeconomic History    Marital status: Married   Tobacco Use    Smoking status: Never    Smokeless tobacco: Never   Vaping Use    Vaping status: Never Used   Substance and Sexual Activity    Alcohol use: Never    Drug use: Never     Social Drivers of Psychologist, [employer] Strain: Low Risk (10/01/2021)    Received from [facility]    Overall Financial Resource Strain (CARDIA)     Difficulty of Paying Living Expenses: Not hard at all   Food Insecurity: No Food Insecurity (10/01/2021)    Received from [facility]    Hunger Vital Sign     Within the past 12 months, you worried that your food would run out before you got the money to buy more.: Never true     Within the past 12 months, the food you bought just didn't last and you didn't have money to get more.: Never true   Transportation Needs: No Transportation Needs (10/01/2021)    Received from [facility] - Transportation  Lack of Transportation (Medical): No     Lack of Transportation (Non-Medical): No   Physical Activity: Sufficiently Active (10/01/2021)    Received from [facility]    Exercise Vital Sign     On average, how many days per week do you engage in moderate to strenuous exercise (like a brisk walk)?: 6 days     On average, how many minutes do you engage in exercise at this level?: 40 min   Stress: No Stress Concern Present (10/01/2021)    Received from [facility] - Occupational Stress Questionnaire     Feeling of Stress : Only a little   Social Connections: Moderately Isolated (10/01/2021)    Received from [facility]    Social Connection and Isolation Panel     In a typical week, how many times do you talk on the phone with family, friends, or neighbors?: More than three times a week     How often do you get together with friends or relatives?: Once a week     How often do you attend church or religious services?: Never      Do you belong to any clubs or organizations such as church groups, unions, fraternal or athletic groups, or school groups?: No     How often do you attend meetings of the clubs or organizations you belong to?: Never     Are you married, widowed, divorced, separated, never married, or living with a partner?: Married   Intimate Partner Violence: Not At Risk (10/01/2021)    Received from [facility]    Humiliation, Afraid, Rape, and Kick questionnaire     Within the last year, have you been afraid of your partner or ex-partner?: No     Within the last year, have you been humiliated or emotionally abused in other ways by your partner or ex-partner?: No     Within the last year, have you been kicked, hit, slapped, or otherwise physically hurt by your partner or ex-partner?: No     Within the last year, have you been raped or forced to have any kind of sexual activity by your partner or ex-partner?: No   Housing Stability: Low Risk (10/01/2021)    Received from [facility] Stability Vital Sign     Unable to Pay for Housing in the Last Year: No     Number of Places Lived in the Last Year: 1     Unstable Housing in the Last Year: No        REVIEW OF SYSTEMS:  Review of Systems   Constitutional:  Negative for activity change, appetite change, chills, fatigue, fever and unexpected weight change.   HENT:  Negative for sinus pain and sore throat.    Respiratory:  Negative for cough, chest tightness, shortness of breath and wheezing.    Cardiovascular:  Negative for chest pain and palpitations.   Gastrointestinal:  Negative for abdominal distention, abdominal pain, blood in stool, constipation, diarrhea, nausea and vomiting.   Genitourinary:  Negative for difficulty urinating, dysuria, hematuria and urgency.   Musculoskeletal:  Negative for arthralgias, joint swelling, myalgias and neck stiffness.   Skin:  Negative for rash.   Neurological:  Negative for dizziness, syncope, weakness, light-headedness, numbness and  headaches.   Hematological:  Negative for adenopathy. Does not bruise/bleed easily.   Psychiatric/Behavioral:  Negative for confusion and hallucinations.  VITAL SIGNS:  BP 131/77 (BP Site: Right Upper Arm, Patient Position: Sitting, BP Cuff Size: Large Adult)   Pulse 65   Temp 98.5 F (36.9 C)   Ht 1.829 m (6')   Wt 98 kg (216 lb)   SpO2 96%   BMI 29.29 kg/m     PHYSICAL EXAM:    Constitutional:  Normal appearance; no acute distress  Eyes:  Conjunctiva normal; eyelids normal; sclera anicteric  Ear, Nose, Throat:  External ears and nose normal; hearing grossly normal; oropharynx unremarkable  Neck:  Supple; no masses; no adenopathy  Respiratory:  Breathing comfortably; lungs clear to auscultation and percussion  Cardiovascular:  Normal heart sounds; regular rate and rhythm; no peripheral edema  Abdomen:  No masses; no tenderness; no hepatomegaly; no splenomegaly; bowel sounds normal  Lymph nodes:  No cervical, axillary, supraclavicular or inguinal adenopathy  Skin:  No rash; no petechiae; no ecchymoses; no pallor; no cutaneous nodules  Musculoskeletal:  Normal muscle strength  Neurologic:  No focal motor or sensory deficit; normal gait; no abnormal mental status  Psychiatric:  Oriented to person time and place; mood and affect appropriate to situation; appropriate judgment and insight; memory intact    LABORATORY DATA:     CBC:  Recent Labs     03/03/24  1446   WBC 3.7*   RBC 5.26   HGB 16.7   HCT 50.3   MCV 95.6   RDW 13.1   PLT 156     CHEMISTRIES:  Recent Labs     03/03/24  1446   NA NOTE   K 4.1   CL 106   CO2 26   BUN 27*   CREATININE 1.3   GLUCOSE 87     PT/INR:No results for input(s): PROTIME, INR in the last 72 hours.  APTT:No results for input(s): APTT in the last 72 hours.  LIVER PROFILE:  Recent Labs     03/03/24  1446   AST 28   ALT 27   BILITOT 0.57   ALKPHOS 56             RADIOLOGY RESULTS:     No results found.      Impression & Plan:      1. Iron deficiency [E61.1]    2. PNH  (paroxysmal nocturnal hemoglobinuria) (HCC)          PLAN:  Patient has a hematocrit of 50.3 today.  He will have a phlebotomy.  I have instructed him to discontinue iron since he is not tolerating oral iron.  I will monitor his ferritin and if he becomes iron deficient again, I would give him parenteral iron.  He is not having any evidence of hemolysis  "

## 2024-03-03 NOTE — Patient Instructions (Signed)
"    Post-phlebotomy instructions:    -potential adverse effects: hematoma at needle site, syncope, and nausea/vomiting  -need for hydration for 1-2 days post procedure  -avoid alcohol and caffeine for 24 hours   -avoid strenuous exercise or activities  -avoid smoking 1 hour post procedure  -instructions on self-care if lightheadedness/dizziness occurs (sit down, place head between knees or lie down with legs elevated) and to call MD/911 if symptoms continue or worsen    The amount of blood volume removed today was 527 mL, which was measured on a scale. Patient tolerated the procedure very well. Blood was then disposed in hazardous waste bin per protocol.    All questions and concerns addressed and patient is satisfied with outcome. Phlebotomy ordered by Dr. Kassie, call MD office at [phone] with any questions.     "

## 2024-03-04 LAB — FERRITIN: Ferritin: 32.6 ng/mL (ref 30.0–400.0)

## 2024-03-05 LAB — SOLUBLE TRANSFERRIN RECEPTOR: Soluble Transferrin Recept: 20.1 nmol/L (ref 12.2–27.3)

## 2024-03-15 ENCOUNTER — Encounter

## 2024-03-24 ENCOUNTER — Encounter

## 2024-03-31 ENCOUNTER — Inpatient Hospital Stay: Admit: 2024-03-31 | Discharge: 2024-03-31 | Payer: BLUE CROSS/BLUE SHIELD | Primary: Hematology & Oncology

## 2024-03-31 ENCOUNTER — Encounter

## 2024-03-31 ENCOUNTER — Other Ambulatory Visit: Admit: 2024-03-31 | Discharge: 2024-03-31 | Payer: BLUE CROSS/BLUE SHIELD | Primary: Hematology & Oncology

## 2024-03-31 VITALS — BP 127/83 | HR 102 | Temp 98.50000°F | Resp 16

## 2024-03-31 DIAGNOSIS — D751 Secondary polycythemia: Principal | ICD-10-CM

## 2024-03-31 LAB — CBC WITH AUTO DIFFERENTIAL
Basophils %: 0.7 % (ref 0.0–2.0)
Basophils Absolute: 0 x10e3/mcL (ref 0–2)
Eosinophils %: 2.6 % (ref 0.0–7.0)
Eosinophils Absolute: 0.1 x10e3/mcL (ref 0.0–0.5)
Hematocrit: 51.1 % (ref 38.0–52.0)
Hemoglobin: 16.8 g/dL (ref 13.0–17.3)
Immature Grans (Abs): 0.01 x10e3/mcL (ref 0.00–0.06)
Immature Granulocytes %: 0.2 % (ref 0.0–0.6)
Lymphocytes Absolute: 0.7 x10e3/mcL — ABNORMAL LOW (ref 1.0–3.2)
Lymphocytes: 15.6 % (ref 15.0–45.0)
MCH: 31.1 pg (ref 27.0–34.5)
MCHC: 32.9 g/dL (ref 30.0–36.0)
MCV: 94.6 fL (ref 84.0–100.0)
MPV: 9.1 fL (ref 7.0–12.2)
Monocytes %: 6.7 % (ref 4.0–12.0)
Monocytes Absolute: 0.3 x10e3/mcL (ref 0.3–1.0)
Neutrophils %: 74.2 % — ABNORMAL HIGH (ref 42.0–74.0)
Neutrophils Absolute: 3.4 x10e3/mcL (ref 1.6–7.3)
Platelets: 160 x10e3/mcL (ref 140–440)
RBC: 5.4 x10e3/mcL (ref 4.00–5.60)
RDW: 12.4 % (ref 10.0–17.0)
WBC: 4.6 x10e3/mcL (ref 3.8–10.6)

## 2024-03-31 LAB — COMPREHENSIVE METABOLIC PANEL
ALT: 29 U/L (ref 0–42)
AST: 25 U/L (ref 0–46)
Albumin/Globulin Ratio: 1.96 (ref 1.00–2.70)
Albumin: 4.5 g/dL (ref 3.5–5.2)
Alk Phosphatase: 67 U/L (ref 40–130)
Anion Gap: 8 mmol/L (ref 2–17)
BUN: 28 mg/dL — ABNORMAL HIGH (ref 6–20)
CALCIUM,CORRECTED,CCA: 9.4 mg/dL (ref 8.5–10.7)
CO2: 30 mmol/L — ABNORMAL HIGH (ref 22–29)
Calcium: 9.8 mg/dL (ref 8.5–10.7)
Chloride: 103 mmol/L (ref 98–107)
Creatinine: 1.3 mg/dL (ref 0.7–1.3)
Est, Glom Filt Rate: 64 mL/min/1.73m?? (ref >=60)
Globulin: 2.3 g/dL (ref 1.9–4.4)
Glucose: 81 mg/dL (ref 70–99)
Osmolaliy Calculated: 285 mosm/kg (ref 270–287)
Potassium: 4.5 mmol/L (ref 3.5–5.3)
Sodium: 141 mmol/L (ref 135–145)
Total Bilirubin: 0.38 mg/dL (ref 0.00–1.20)
Total Protein: 6.8 g/dL (ref 5.7–8.3)

## 2024-03-31 LAB — FERRITIN: Ferritin: 18.3 ng/mL — ABNORMAL LOW (ref 30.0–400.0)

## 2024-03-31 NOTE — Patient Instructions (Signed)
"  Post-phlebotomy Patient instructions:    -Drink fluids for 1-2 days post procedure  -Avoid alcohol and caffeine for 24 hours   -Avoid strenuous exercise or activities  -Avoid smoking 1 hour post procedure  -If lightheadedness/dizziness occurs sit down, place head between knees or lie down with legs elevated. Call MD/911 if symptoms continue or worsen.    The amount of blood volume removed today was 525 mL, which was measured on a scale. Patient tolerated the procedure very well. Blood was then disposed in hazardous waste bin per protocol.    All questions and concerns addressed and patient is satisfied with outcome. Phlebotomy ordered by Dr. Kassie, call MD office at [phone]  with any questions.     "

## 2024-04-03 LAB — TESTOSTERONE, FREE/TOT EQUILIB
Testosterone % Free: 2.25 % (ref 1.50–4.20)
Testosterone, Free: 8.1 ng/dL (ref 5.00–21.00)
Testosterone: 360 ng/dL (ref 264–916)

## 2024-04-05 ENCOUNTER — Encounter

## 2024-04-07 NOTE — Telephone Encounter (Signed)
"  I spoke with patient and he is scheduled   "

## 2024-04-09 ENCOUNTER — Telehealth
Admit: 2024-04-09 | Discharge: 2024-04-09 | Payer: BLUE CROSS/BLUE SHIELD | Attending: "Endocrinology | Primary: Hematology & Oncology

## 2024-04-09 DIAGNOSIS — E291 Testicular hypofunction: Principal | ICD-10-CM

## 2024-04-09 MED ORDER — NATESTO 5.5 MG/ACT NA GEL
5.5 | NASAL | 5 refills | 30.00000 days | Status: AC
Start: 2024-04-09 — End: 2024-10-06

## 2024-04-09 NOTE — Telephone Encounter (Signed)
 I spoke with Brittney at Dr. Laymond office and relayed message for Dr. Kassie to call Dr. Clotilda on his cell.

## 2024-04-09 NOTE — Progress Notes (Signed)
 [facility]  Tel: [phone]  Fax: [phone]       Endocrinology Note        Chief Complaint   Patient presents with    Follow-up     Labs done at [facility]         HPI:     04/09/24   This is a synchronous interactive virtual visit done by video and audio components from provider office to patient home. Patient consent was obtained.     History of Present Illness  The patient is a 59 year old male who presents via virtual visit for follow-up of hypogonadism. He was last seen in 01/2024.    He reports that his insurance initially approved his compounded testosterone  prescription but subsequently sent a letter indicating disapproval. This has led to confusion and financial strain, as the cost of the medication exceeds $1000. He has been collaborating with his pharmacist to utilize a discount card, which would reduce the cost to approximately $300 for a three-month supply. He is also exploring the option of compounding, which could potentially lower the cost to around $260 for the same duration. He is considering a switch to Natesto  nasal gel, which he believes may not elevate his hematocrit levels as rapidly. He has been geophysicist/field seismologist on this treatment option and has found some supportive evidence.    He has been experiencing a rapid increase in his hematocrit levels, necessitating phlebotomies every four weeks. This frequent procedure has resulted in a significant decrease in his iron levels, which are being monitored by his hematologist. He is seeking a solution to prevent the rapid rise in his hematocrit levels. His hematologist, Dr. Kassie, attributes this issue to his testosterone  therapy. He has been undergoing phlebotomies since 2021 and is unable to take iron supplements due to gastrointestinal side effects. Consequently, his iron levels have dropped to nearly below normal, and he is scheduled for an iron infusion in 05/2024. He reports feeling fatigued and sleepy, with low  testosterone  and iron levels.         Past medical, social, family history and allergies:     Reviewed and documented in respective sections of the chart.        ROS:     Other than what was mentioned in HPI, a thorough 11 point Review of systems was done and was negative.      PE:     Vital Signs:        04/08/2024 03/31/2024 03/03/2024 02/17/2024 01/29/2024 12/26/2023 12/23/2023   Combined Vitals   Blood Pressure  127/83    120/82    130/80 115/79    133/77    131/77  115/75    140/81    117/78     Pulse  102    80    83 90    66    65  67    73    67     Temp  98.5 F (36.9 C) 98.5 F (36.9 C)       Weight   216 lb       Pt-Reported Weight 210   205  205 205   SpO2  96 % 96 %           Multiple values from one day are sorted in reverse-chronological order       There is no height or weight on file to calculate BMI.      General: The patient is in  no acute distress  HEENT: PERRLA, EOMI  Neck: Supple, no JVD  Thyroid: no visible goiter  Abdomen: Relaxed, nondistended  Extremities: No cyanosis, clubbing or edema  Neurological: No lateralization      Labs:       Review of endocrinology test results:       TSH, 3rd Generation (mcIU/mL)   Date Value   09/22/2020 2.400   05/24/2020 1.580     TSH ((uIU/mL))   Date Value   10/30/2017 1.940     Glucose   Date Value   03/31/2024 81 mg/dL   87/96/7974 87 mg/dL   89/69/7974 90 mg/dL   95/80/7978 77 (mg/dL)     Triglycerides ((mg/dL))   Date Value   95/80/7978 107     Creatinine (mg/dL)   Date Value   87/68/7974 1.3   03/03/2024 1.3   01/29/2024 1.3          ASSESSMENT AND PLAN:     1. Hypogonadism in male  -     Testosterone  (NATESTO ) 5.5 MG/ACT GEL; Administer intranasally 1 pump actuation in each nostril 3 times daily (6 to 8 hours apart), for a total daily dose of 6 pump actuations equal 33 mg/day.  Dispense 3 pumps and 5 refills., Disp-3 each, R-5Normal  -     PSA Screening; Future  -     CBC with Auto Differential; Future  -     Estradiol; Future  -     Testosterone ;  Future  -     Testosterone , Free/Tot Equilib; Future  -     Follicle Stimulating Hormone; Future  -     Luteinizing Hormone; Future  2. Testicular hypofunction  -     PSA Screening; Future  -     CBC with Auto Differential; Future  -     Estradiol; Future  -     Testosterone ; Future  -     Testosterone , Free/Tot Equilib; Future  -     Comprehensive Metabolic Panel; Future  -     Ferritin; Future  -     Iron and TIBC; Future  -     CBC with Auto Differential; Future  3. Alopecia  4. Paroxysmal nocturnal hemoglobinuria (PNH) (HCC)  5. Iron deficiency  -     PSA Screening; Future  -     CBC with Auto Differential; Future  -     Estradiol; Future  -     Testosterone ; Future  -     Testosterone , Free/Tot Equilib; Future  -     Comprehensive Metabolic Panel; Future  -     Ferritin; Future  -     Iron and TIBC; Future  -     CBC with Auto Differential; Future  6. Skin irritation due to topical agent  7. Skin rash  8. Other iron deficiency anemia  9. PNH (paroxysmal nocturnal hemoglobinuria) (HCC)  -     PSA Screening; Future  -     CBC with Auto Differential; Future  -     Estradiol; Future  -     Testosterone ; Future  -     Testosterone , Free/Tot Equilib; Future  -     Comprehensive Metabolic Panel; Future  -     Ferritin; Future  -     Iron and TIBC; Future  -     CBC with Auto Differential; Future  10. Stage 3a chronic kidney disease (HCC)  -     PSA Screening; Future  -  CBC with Auto Differential; Future  -     Estradiol; Future  -     Testosterone ; Future  -     Testosterone , Free/Tot Equilib; Future  -     Comprehensive Metabolic Panel; Future  -     Ferritin; Future  -     Iron and TIBC; Future  -     CBC with Auto Differential; Future          Assessment & Plan  1. Hypogonadism:  - Testosterone  levels are within the low normal range, with no significant fluctuations observed.  - Prescription for Natesto  nasal gel will be issued and sent to pharmacy. Advised to print a discount card from the GoodRx website to  potentially reduce the cost of the medication.    2. Erythrocytosis:  - Reports frequent phlebotomies due to elevated hematocrit levels, leading to decreased iron levels. Unable to take iron supplements due to gastrointestinal issues and will need iron infusions starting in 05/2024. Potential impact of testosterone  therapy on hematocrit levels discussed; testosterone  levels are not expected to cause significant erythrocytosis.  - Communication with hematologist, Dr. Kassie, will be initiated to discuss further management strategies.    Follow-up: Iron infusions starting in 05/2024. Communication with hematologist, Dr. Kassie, to discuss further management strategies.         Attestation        The patient (or guardian, if applicable) and other individuals in attendance with the patient were advised that Artificial Intelligence will be utilized during this visit to record, process the conversation to generate a clinical note, and support improvement of the AI technology. The patient (or guardian, if applicable) and other individuals in attendance at the appointment consented to the use of AI, including the recording.       A total of 42 minutes were spent on this case on the day of the encounter, including preparation to see the patient, review of results, meeting with patient, discussion of care, orders and documentation.      An electronic signature was used to authenticate this note.    --EVERLENA JENEANE PACKER, MD

## 2024-04-09 NOTE — Telephone Encounter (Signed)
 I prescribed natesto  testosterone  nasl gel for patient.  It requires a prior authorization.  It is medically necessary   The patient had intolerance/side effects on testosterone  transdermal gel and all other forms of testosterone  administration are contraindicated in his case because of secondary erythrocytosis.  Intramuscular testosterone  is contraindicated and testosterone  pellets are contraindicated  Please do the prior authorization    I need to speak with Dr. Alm Staff, the patient's hematologist about his case.  Please have him call me on my cell today at 1 PM or after 1 to discuss.

## 2024-04-13 NOTE — Telephone Encounter (Signed)
 I spoke with patient and advised and he will read message. Lab orders mailed to patient and address was confirmed. Patient states that he is scheduled for a sleep apnea test follow up prescribed by Dr. Debarah who he saw yesterday.

## 2024-04-16 NOTE — Telephone Encounter (Signed)
 Tried contacting pt to inform pt that he was approved for the Natesto  Gel. LVM

## 2024-04-19 NOTE — Telephone Encounter (Signed)
 Orders mailed to patient

## 2024-04-26 NOTE — Telephone Encounter (Signed)
 Tried contacting pt. LVM advising pt to contact the office.

## 2024-04-26 NOTE — Telephone Encounter (Addendum)
 Spoke to pt and informed pt that the office received another refill request from the pharmacy for the medicine that he was requesting and that PA is being worked on

## 2024-04-26 NOTE — Telephone Encounter (Signed)
 Preauthorization being worked on

## 2024-04-26 NOTE — Telephone Encounter (Signed)
 Patient is returning your call. Please advise. ?

## 2024-04-28 NOTE — Telephone Encounter (Signed)
 Patient would like to know if Dr Clotilda has received his lab results from [facility] - patient needing CBC results for procedure    Please notify patient

## 2024-04-30 NOTE — Telephone Encounter (Signed)
 Spoke to Pt. Pt expressed gratitude for getting labs.

## 2024-05-03 NOTE — Telephone Encounter (Signed)
 Patient is requesting a call back to discuss his Testosterone  prior authorization. Please assist.

## 2024-05-04 NOTE — Telephone Encounter (Signed)
"  Tried contacting pt. LVM  "

## 2024-05-06 NOTE — Telephone Encounter (Signed)
 error

## 2024-05-06 NOTE — Telephone Encounter (Signed)
 Patient is requesting a callback,states he just got a call from his insurance company and would like to speak with you. Please call to advise.
